# Patient Record
Sex: Female | Born: 1975 | Race: White | Hispanic: No | Smoking: Never smoker
Health system: Southern US, Community
[De-identification: ages and names within clinical notes are randomized; demographics above are authoritative.]

## PROBLEM LIST (undated history)

## (undated) ENCOUNTER — Inpatient Hospital Stay (HOSPITAL_COMMUNITY): Payer: Self-pay

## (undated) DIAGNOSIS — F329 Major depressive disorder, single episode, unspecified: Secondary | ICD-10-CM

## (undated) DIAGNOSIS — IMO0002 Reserved for concepts with insufficient information to code with codable children: Secondary | ICD-10-CM

## (undated) DIAGNOSIS — F429 Obsessive-compulsive disorder, unspecified: Secondary | ICD-10-CM

## (undated) DIAGNOSIS — Z8619 Personal history of other infectious and parasitic diseases: Secondary | ICD-10-CM

## (undated) DIAGNOSIS — O165 Unspecified maternal hypertension, complicating the puerperium: Secondary | ICD-10-CM

## (undated) DIAGNOSIS — B009 Herpesviral infection, unspecified: Secondary | ICD-10-CM

## (undated) DIAGNOSIS — F32A Depression, unspecified: Secondary | ICD-10-CM

## (undated) DIAGNOSIS — O9081 Anemia of the puerperium: Secondary | ICD-10-CM

## (undated) DIAGNOSIS — F419 Anxiety disorder, unspecified: Secondary | ICD-10-CM

## (undated) DIAGNOSIS — N200 Calculus of kidney: Secondary | ICD-10-CM

## (undated) DIAGNOSIS — Z87440 Personal history of urinary (tract) infections: Secondary | ICD-10-CM

## (undated) DIAGNOSIS — Z5189 Encounter for other specified aftercare: Secondary | ICD-10-CM

## (undated) DIAGNOSIS — N9089 Other specified noninflammatory disorders of vulva and perineum: Secondary | ICD-10-CM

## (undated) DIAGNOSIS — D649 Anemia, unspecified: Secondary | ICD-10-CM

## (undated) DIAGNOSIS — K259 Gastric ulcer, unspecified as acute or chronic, without hemorrhage or perforation: Secondary | ICD-10-CM

## (undated) HISTORY — DX: Personal history of other infectious and parasitic diseases: Z86.19

## (undated) HISTORY — DX: Encounter for other specified aftercare: Z51.89

## (undated) HISTORY — DX: Major depressive disorder, single episode, unspecified: F32.9

## (undated) HISTORY — DX: Obsessive-compulsive disorder, unspecified: F42.9

## (undated) HISTORY — DX: Gastric ulcer, unspecified as acute or chronic, without hemorrhage or perforation: K25.9

## (undated) HISTORY — DX: Depression, unspecified: F32.A

## (undated) HISTORY — DX: Other specified noninflammatory disorders of vulva and perineum: N90.89

## (undated) HISTORY — DX: Calculus of kidney: N20.0

## (undated) HISTORY — DX: Reserved for concepts with insufficient information to code with codable children: IMO0002

## (undated) HISTORY — PX: WISDOM TOOTH EXTRACTION: SHX21

## (undated) HISTORY — DX: Anxiety disorder, unspecified: F41.9

## (undated) HISTORY — PX: UPPER GASTROINTESTINAL ENDOSCOPY: SHX188

## (undated) HISTORY — DX: Anemia of the puerperium: O90.81

## (undated) HISTORY — DX: Unspecified maternal hypertension, complicating the puerperium: O16.5

## (undated) HISTORY — DX: Personal history of urinary (tract) infections: Z87.440

## (undated) HISTORY — DX: Herpesviral infection, unspecified: B00.9

## (undated) HISTORY — DX: Anemia, unspecified: D64.9

---
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
## 1998-03-09 HISTORY — PX: EYE SURGERY: SHX253

## 2001-03-09 DIAGNOSIS — R87619 Unspecified abnormal cytological findings in specimens from cervix uteri: Secondary | ICD-10-CM

## 2001-03-09 DIAGNOSIS — IMO0002 Reserved for concepts with insufficient information to code with codable children: Secondary | ICD-10-CM

## 2001-03-09 HISTORY — DX: Unspecified abnormal cytological findings in specimens from cervix uteri: R87.619

## 2001-03-09 HISTORY — DX: Reserved for concepts with insufficient information to code with codable children: IMO0002

## 2003-05-08 ENCOUNTER — Emergency Department (HOSPITAL_COMMUNITY): Admission: EM | Admit: 2003-05-08 | Discharge: 2003-05-09 | Payer: Self-pay

## 2003-10-08 DIAGNOSIS — IMO0001 Reserved for inherently not codable concepts without codable children: Secondary | ICD-10-CM

## 2003-10-08 DIAGNOSIS — Z5189 Encounter for other specified aftercare: Secondary | ICD-10-CM

## 2003-10-08 HISTORY — DX: Reserved for inherently not codable concepts without codable children: IMO0001

## 2003-10-08 HISTORY — DX: Encounter for other specified aftercare: Z51.89

## 2004-10-21 ENCOUNTER — Inpatient Hospital Stay (HOSPITAL_COMMUNITY): Admission: AD | Admit: 2004-10-21 | Discharge: 2004-10-23 | Payer: Self-pay | Admitting: Internal Medicine

## 2004-10-21 ENCOUNTER — Ambulatory Visit: Payer: Self-pay | Admitting: Internal Medicine

## 2004-10-31 ENCOUNTER — Ambulatory Visit: Payer: Self-pay | Admitting: Gastroenterology

## 2004-11-14 ENCOUNTER — Ambulatory Visit: Payer: Self-pay | Admitting: Gastroenterology

## 2004-11-14 ENCOUNTER — Encounter (INDEPENDENT_AMBULATORY_CARE_PROVIDER_SITE_OTHER): Payer: Self-pay | Admitting: Specialist

## 2005-01-27 ENCOUNTER — Ambulatory Visit: Payer: Self-pay | Admitting: Gastroenterology

## 2005-02-06 ENCOUNTER — Ambulatory Visit: Payer: Self-pay | Admitting: Gastroenterology

## 2005-02-06 ENCOUNTER — Encounter (INDEPENDENT_AMBULATORY_CARE_PROVIDER_SITE_OTHER): Payer: Self-pay | Admitting: *Deleted

## 2006-03-09 HISTORY — PX: KNEE SURGERY: SHX244

## 2007-03-10 DIAGNOSIS — N9089 Other specified noninflammatory disorders of vulva and perineum: Secondary | ICD-10-CM

## 2007-03-10 HISTORY — DX: Other specified noninflammatory disorders of vulva and perineum: N90.89

## 2009-03-09 DIAGNOSIS — B009 Herpesviral infection, unspecified: Secondary | ICD-10-CM

## 2009-03-09 HISTORY — DX: Herpesviral infection, unspecified: B00.9

## 2009-05-17 ENCOUNTER — Emergency Department (HOSPITAL_COMMUNITY): Admission: EM | Admit: 2009-05-17 | Discharge: 2009-05-17 | Payer: Self-pay | Admitting: Emergency Medicine

## 2010-06-01 LAB — URINE MICROSCOPIC-ADD ON

## 2010-06-01 LAB — URINALYSIS, ROUTINE W REFLEX MICROSCOPIC
Bilirubin Urine: NEGATIVE
Glucose, UA: NEGATIVE mg/dL
Ketones, ur: NEGATIVE mg/dL
Leukocytes, UA: NEGATIVE
Nitrite: NEGATIVE
Protein, ur: NEGATIVE mg/dL
Specific Gravity, Urine: 1.009 (ref 1.005–1.030)
Urobilinogen, UA: 0.2 mg/dL (ref 0.0–1.0)
pH: 6 (ref 5.0–8.0)

## 2010-06-01 LAB — CBC
HCT: 38.5 % (ref 36.0–46.0)
Hemoglobin: 12.4 g/dL (ref 12.0–15.0)
MCHC: 32.2 g/dL (ref 30.0–36.0)
MCV: 90.1 fL (ref 78.0–100.0)
Platelets: 277 10*3/uL (ref 150–400)
RBC: 4.27 MIL/uL (ref 3.87–5.11)
RDW: 13.4 % (ref 11.5–15.5)
WBC: 8.6 10*3/uL (ref 4.0–10.5)

## 2010-06-01 LAB — DIFFERENTIAL
Basophils Absolute: 0 10*3/uL (ref 0.0–0.1)
Basophils Relative: 0 % (ref 0–1)
Eosinophils Absolute: 0.1 10*3/uL (ref 0.0–0.7)
Eosinophils Relative: 1 % (ref 0–5)
Lymphocytes Relative: 14 % (ref 12–46)
Lymphs Abs: 1.2 10*3/uL (ref 0.7–4.0)
Monocytes Absolute: 0.4 10*3/uL (ref 0.1–1.0)
Monocytes Relative: 5 % (ref 3–12)
Neutro Abs: 6.8 10*3/uL (ref 1.7–7.7)
Neutrophils Relative %: 80 % — ABNORMAL HIGH (ref 43–77)

## 2010-06-01 LAB — URINE CULTURE
Colony Count: NO GROWTH
Culture: NO GROWTH

## 2010-06-01 LAB — POCT I-STAT, CHEM 8
BUN: 7 mg/dL (ref 6–23)
Calcium, Ion: 1.12 mmol/L (ref 1.12–1.32)
Chloride: 107 mEq/L (ref 96–112)
Creatinine, Ser: 1 mg/dL (ref 0.4–1.2)
Glucose, Bld: 97 mg/dL (ref 70–99)
HCT: 34 % — ABNORMAL LOW (ref 36.0–46.0)
Hemoglobin: 11.6 g/dL — ABNORMAL LOW (ref 12.0–15.0)
Potassium: 3.8 mEq/L (ref 3.5–5.1)
Sodium: 138 mEq/L (ref 135–145)
TCO2: 25 mmol/L (ref 0–100)

## 2010-06-01 LAB — POCT PREGNANCY, URINE: Preg Test, Ur: NEGATIVE

## 2010-07-25 NOTE — Discharge Summary (Signed)
NAMELADONNA, Krista George NO.:  [id number]   MEDICAL RECORD NO.:  [id number]          PATIENT TYPE:  INP   LOCATION:  5019                         FACILITY:  [facility]   PHYSICIAN:  Rachael Fee, M.D. DATE OF BIRTH:  [birth year]   DATE OF ADMISSION:  10/21/2004  DATE OF DISCHARGE:  10/23/2004                                 DISCHARGE SUMMARY   ADMITTING DIAGNOSES:  1.  Melena and anemia secondary to presumed upper gastrointestinal bleed.      Probably ulcer disease related to chronic nonsteroidal use. Currently      anemic as well secondary to gastrointestinal bleed.  2.  Chronic headaches of undetermined etiology.  3.  Chronic depression.  4.  History of esophageal ulcers at age 31 treated with silver nitrate.  5.  Status post Lasik surgery for correction of myopia.  6.  History of childhood anemia.  7.  Tonsillectomy.  8.  Allergy to aspirin, has caused dyspnea in the past.   DISCHARGE DIAGNOSES:  1.  Upper gastrointestinal bleed secondary to nonsteroidal-induced antral      ulcer.  2.  Status post upper endoscopy by Dr. Rob Bunting on October 21, 2004; at      which time, the actively bleeding antral ulcer was treated with      epinephrine injection and application of BICAP therapy.  3.  CLO-testing for presence of Helicobacter pylori was negative.  4.  Anemia secondary to acute gastrointestinal blood loss. Status post      transfusion with total of 1-3 units of packed red blood cells.  5.  Chronic headaches of undetermined etiology for which she uses chronic      over-the-counter analgesics. Has outpatient follow-up to be evaluated [facility]      [facility] by Dr. Neale Burly.   BRIEF HISTORY:  Mrs. Krista George is a 35 year old white female who had been  taking a couple of ibuprofen a day for the last several months to control  headaches. She was not clear what caused the headaches but thought that they  might be from sinus problems, but she has never seen a  headache specialist.  For several days prior to presenting to [facility] on October 21, 2004, she  had been having one to two dark tarry stools daily. She progressed to  weakness and fatigue and felt quite dizzy and noticed rapid heart rate. She  had some vague, mild epigastric pain and nausea only when the abdomen was  palpated. She had noted anorexia over the past several days but in general  had a good appetite and had gained 20 pounds within the last several months.  The patient denied dysphagia, heartburn or reflux and other than the black  stool had not had a change in bowel habits. She went to [facility] on [address] [facility] [address] where they measured her hemoglobin at 8.2. MCV was normal.  Coagulases were also normal. She was referred over to [facility]  Emergency Room for management of acute GI bleed.   Ultimately, after being seen in the emergency room by Dr. Leone Payor,  who  ordered her to be admitted, she was transferred to [facility] for  the actual admission as inpatient beds at [facility] were in short  supply.   PROCEDURES:  Upper endoscopy by Dr. Rob Bunting on October 21, 2004  findings noted above.   CONSULTATIONS:  None   LABORATORY:  Initial hemoglobin 5.7, it was 8.9 at discharge. Hematocrit  went from 16.4 up to 26.1. Platelets 257,000. CLO testing for Helicobacter  was negative. PT 14, INR 1.1, PTT 36. Hemoglobin initially 7.8, hemoglobin  low of 5.7. It was 8.9 at discharge. The hematocrit low of 16.4, it was 23.7  at discharge. Platelets 257,000. Sodium 146, potassium 3.7, chloride 104,  carbon dioxide 28, glucose 105, BUN 21, creatinine 0.7. Albumin 3.1. Total  bilirubin, alkaline phosphate, AST, ALT all within normal limits.   HOSPITAL COURSE:  The patient was admitted to a nontelemetry bed. Blood  pressure when she initially arrived at [facility] was 139/88 with  a rapid pulse of 140; however, with hydration blood pressure went  to 120/69  and pulse dropped to 112. During hospitalization, she had serial checks of  CBCs. The initial hemoglobin at [facility] was 7.8 but by the following  morning, several hours later, it was 5.7. She ultimately received  transfusion with a total of three units of packed red blood cells during the  course of hospitalization.   During the course of hospitalization, her tachycardia resolved. Her blood  pressure stabilized and she was afebrile.   Following stabilization, she underwent upper endoscopy on hospital day #1.  She had an actively oozing gastric ulcer which was hemostatic which was  treated with hemostatic therapy by Dr. Christella Hartigan, details are outlined above.   Following the upper endoscopy and during the course of hospitalization, she  did not have any further bowel movements. Hemoglobin and hematocrit  stabilized following transfusions. Her headaches were treated with Fioricet  which seemed to provide adequate analgesia. She was reminded several times  not to resume use of any nonsteroidal or aspirin products for any reason.  The patient initially was kept on n.p.o. but ultimately was tolerating a  regular diet. She was discharged home in stable condition. Plan for follow-  up were to be reendoscoped by Dr. Christella Hartigan on November 14, 2004 and to see the  nurse for preoperative visit before the endoscopy October 31, 2004. She had  an appointment to be seen at the [facility] by Dr. Neale Burly on Monday,  October 27, 2004.   DISCHARGE MEDICATIONS:  1.  Protonix milligrams 40 twice daily for 2 weeks and then once daily.  2.  Iron sulfate 325 milligrams once daily.  3.  Fioricet one to two q.4h. as needed.  4.  Ortho Tri-Cyclen once daily.   Note the patient was cautioned to watch the total amount of acetaminophen  she takes on a daily basis and was advised not to take more than 4000 milligrams a day and was told that each Fioricet does contain 325 milligrams  of acetaminophen  per tablet. She was advised to take less Tylenol if she  were to drink alcohol which is not issue for this patient as she does not  drink regularly.      Jennye Moccasin, P.A. [facility]    ______________________________  Rachael Fee, M.D.    SG/MEDQ  D:  10/31/2004  T:  11/01/2004  Job:  [id number]   cc:   Santiago Glad  [address], [address]  [place]  [place] [address]  Fax: [phone]   Rachael Fee, M.D.   [facility]  [address], [place], [place]  [address]

## 2010-07-25 NOTE — H&P (Signed)
NAMEMILLETTE, HALBERSTAM NO.:  [id number]   MEDICAL RECORD NO.:  [id number]          PATIENT TYPE:  EMS   LOCATION:  ED                           FACILITY:  [facility]   PHYSICIAN:  Iva Boop, M.D. LHCDATE OF BIRTH:  Jan 27, 1976   DATE OF ADMISSION:  10/21/2004  DATE OF DISCHARGE:                                HISTORY & PHYSICAL   CHIEF COMPLAINT:  Melena for 4-5 days with weakness and dizziness.   HISTORY OF PRESENT ILLNESS:  This is a pleasant 35 year old white woman that  has been taking ibuprofen at least two a day since January because of right-  sided and central headache.  The headaches she thinks are related to sinus  problems because she has a lot of congestion.  They are not associated with  any other symptoms at this time that I am aware of.  For the past several  days she started having 1-2 melenic stools a day and became progressively  weak and fatigued.  She spent the weekend in bed and was very tired.  She  has been presyncopal, dizzy and can tell she is tachycardic.  She has not  described any significant abdominal pain other than vague mild epigastric  pain at times, she has nausea when the abdomen is palpated.  She says her  appetite has been generally off in the past few days but she had been eating  more over the past seven months and gained 20 pounds.  She does not describe  dysphagia or significant heartburn, reflux or other changes in he bowel  habits.   In the [facility] emergency room her pulse was 140 initially and then 112  after some fluids.  She was recently on Wellbutrin and that was stopped one  week ago due to irritability.  No other medications other than Ortho-Tri-  Cyclen a day.   ALLERGIES:  No known drug allergies but she says ASPIRIN CAUSES ABDOMINAL  PAIN.  She is sensitive to that.   PAST MEDICAL HISTORY:  1.  Depression.  Three episodes since she was 19.  Followed by a      psychiatrist here.  2.  She said she had  esophageal ulcers at age 66 and was given silver      nitrate.  3.  Lasix surgery though recently required glasses for myopia.  4.  Tonsillectomy.  5.  Was anemic as a child.   REVIEW OF SYSTEMS:  As above.  Has needed glasses.  She wonders if that is  related to her headaches.  She describes no other visual disturbance.  She  has some chronic congestion but no epistaxis or other sinus symptoms.  She  has the headaches that occur on a daily basis.   All other systems negative at this time.   SOCIAL HISTORY:  The patient is single.  She does not smoke having quit at  age 14, rare alcohol, no drug use.  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   FAMILY HISTORY:  Mother  has hypertension.  Father has possible aortic  aneurysm.   PHYSICAL EXAMINATION:  GENERAL:  Reveals a pleasant young woman in no acute  distress.  VITAL SIGNS:  Initially blood pressure 139/88, pulse 140 and then 120/69  with a pulse of 112, temperature 100, respirations 20.  HEENT:  Eyes are anicteric. Conjunctivae are pale.  The mouth and posterior  pharynx are free of lesions.  The mucosa is pale.  NECK:  Supple without thyromegaly or mass.  CHEST:  Clear.  HEART:  S1 and S2 no murmur, rub or gallop.  Tachycardic mildly.  ABDOMEN:  Soft with complaints of nausea when I palpate the epigastrium but  no tenderness.  There is no organomegaly or masses.  Bowel sounds are  present.  She had melena on rectal exam in the emergency department per Dr.  Lynelle Doctor the physician on duty.  I did not repeat that.  EXTREMITIES:  No edema.  SKIN:  Pale.  She has two tattoos on the right lower and left lower  quadrant.  NECK:  Lymph nodes in the neck no supraclavicular or inguinal adenopathy  palpated.  NEUROLOGIC:  She is alert and oriented x3.  Nonfocal exam.   LABORATORY DATA:  Hemoglobin 8.2 [facility] with labs here showing a  hemoglobin of 7.8,  MCV 85, platelets 334, RDW 13, white count 9.4.  CMET  normal except for a glucose of 105, BUN 21 is normal but the creatinine is  0.7.  Pro time is normal with an INR of 1.1.  PTT is normal at 26.   ASSESSMENT:  1.  Melena and anemia secondary to blood loss, with presumed upper      gastrointestinal bleed.  I think this is probably related to her      constant nonsteroidal use.  2.  Chronic headaches, etiology is unclear at this point.  Neurologic exam      is nonfocal.  Will need further evaluation though.  Would hold this      pending GI evaluation.  3.  Chronic depression.   PLAN:  1.  Admit.  2.  Hydrate.  3.  Transfusion has been recommended both by the emergency room physician      and myself and the patient declines at this point.  Risks, benefits,      indications were described including the risks of transfusion, reaction,      infection, hepatitis and AID's but that is quite rare.  I think that      given her presyncope problems that she will have a tough time without      some blood though we will await and see at this point.  4.  Plan for EGD by Dr. Wendall Papa later today to sort out the cause of her      bleeding.  5.  Further headache evaluation over time, she certainly needs to stop      nonsteroidal use.  6.  Further plans pending the above.      Iva Boop, M.D. [facility]  Electronically Signed     CEG/MEDQ  D:  10/21/2004  T:  10/21/2004  Job:  [id number]   cc:   [facility]

## 2010-09-29 IMAGING — CT CT ABD-PELV W/ CM
2 of 4 series · 17 of 46 positions shown, 19 images · IV contrast (agent unspecified)
Comparison: 05/09/2003

CLINICAL DATA: Right lower quadrant and flank pain.

CT ABDOMEN AND PELVIS WITH CONTRAST
TECHNIQUE: Multidetector CT imaging of the abdomen and pelvis was
performed following the standard protocol during bolus
administration of intravenous contrast.
Contrast: 100 ml Dmnipaque-1BB and oral contrast

[Series 2: rtn a/p with · axial · 0.77mm/px · z∈[-486,-60]mm · 14 of 99 slices shown, 16 images]
[im 7/99  soft-tissue]
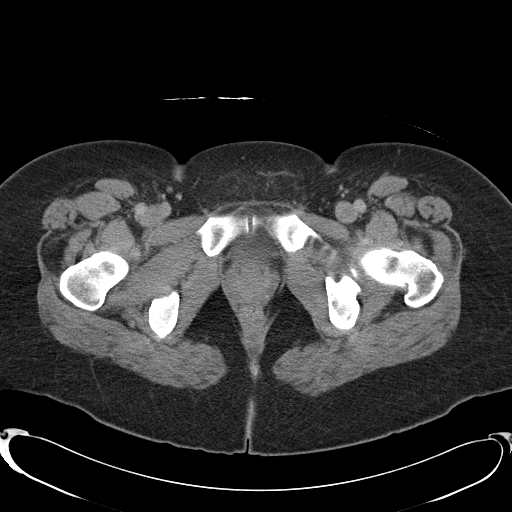
[im 7/99  bone]
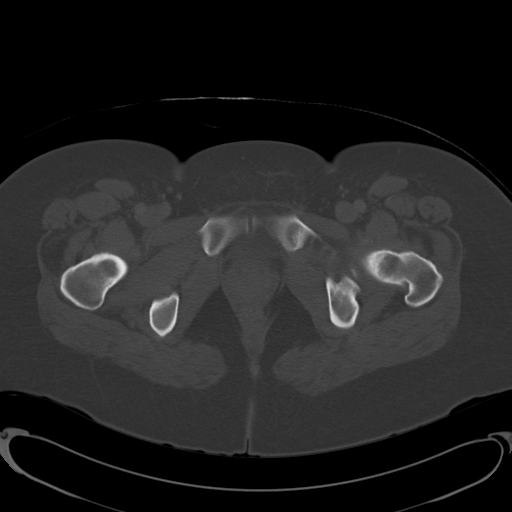
[im 13/99  soft-tissue]
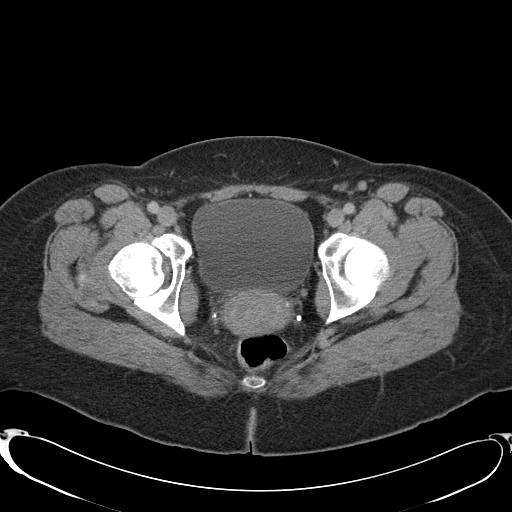
[im 19/99  soft-tissue]
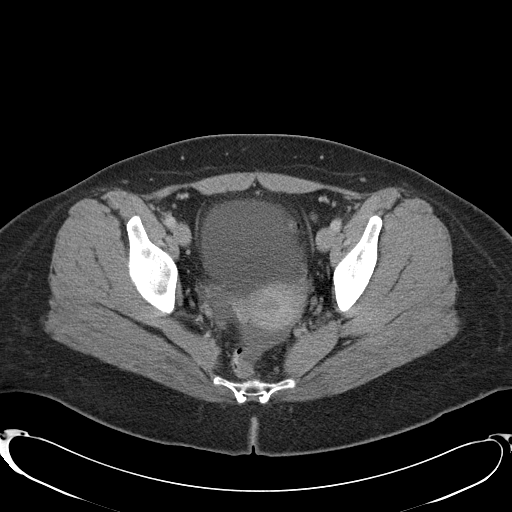
[im 26/99  soft-tissue]
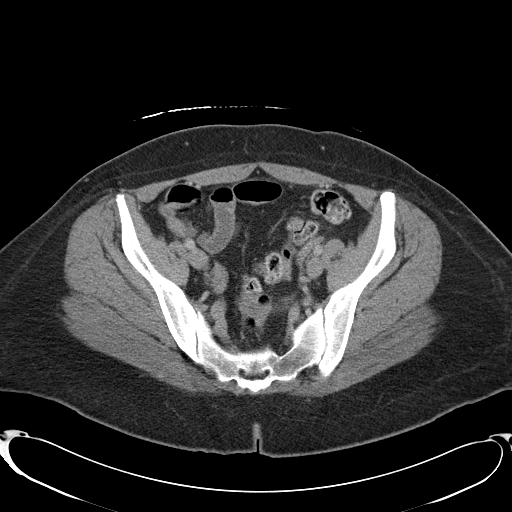
[im 32/99  soft-tissue]
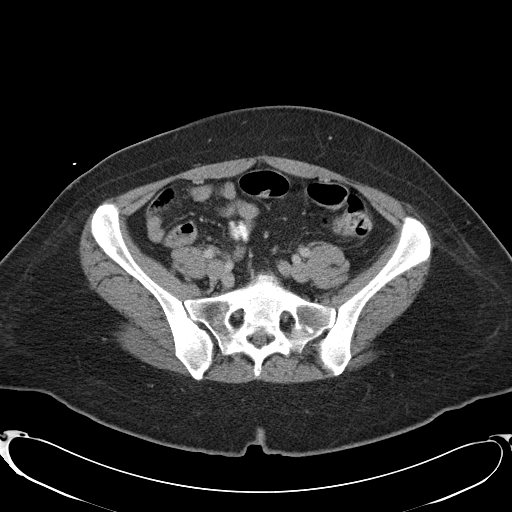
[im 38/99  soft-tissue]
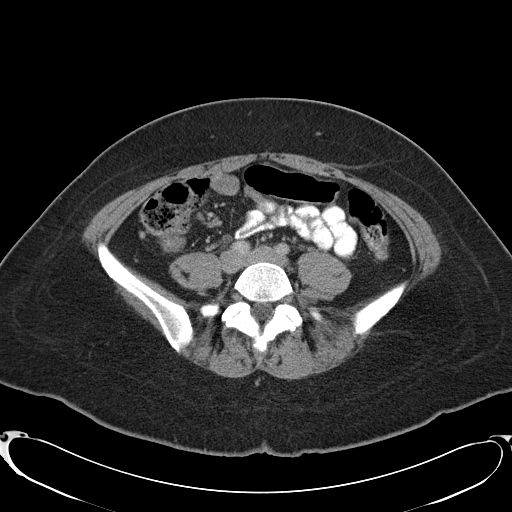
[im 45/99  soft-tissue]
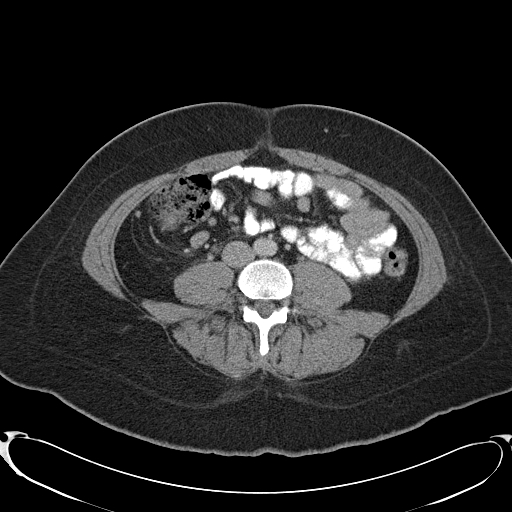
[im 54/99  soft-tissue]
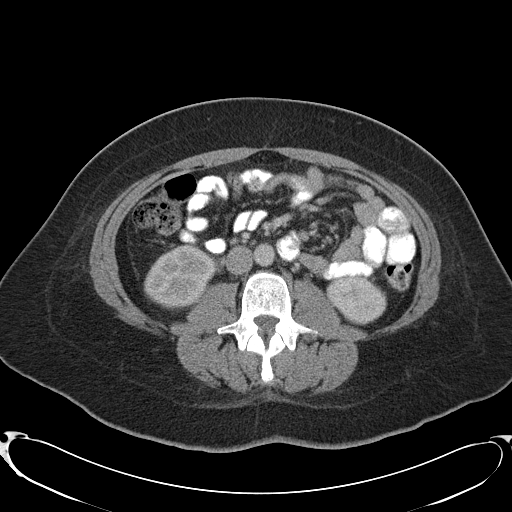
[im 61/99  soft-tissue]
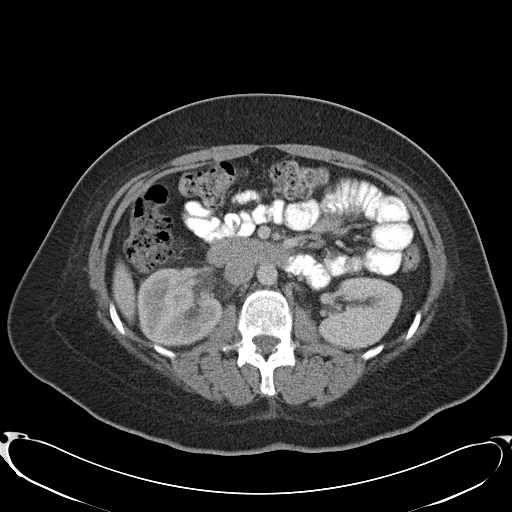
[im 61/99  bone]
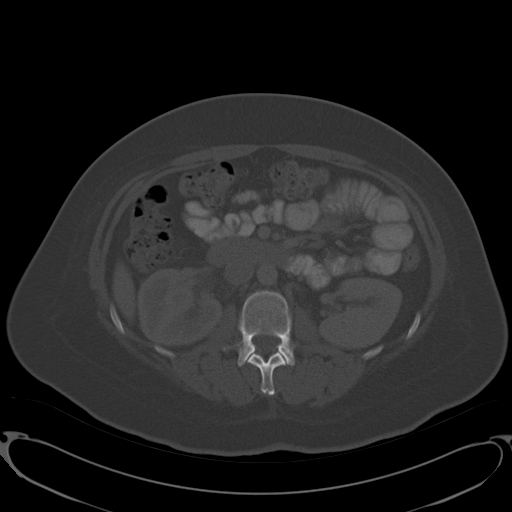
[im 67/99  soft-tissue]
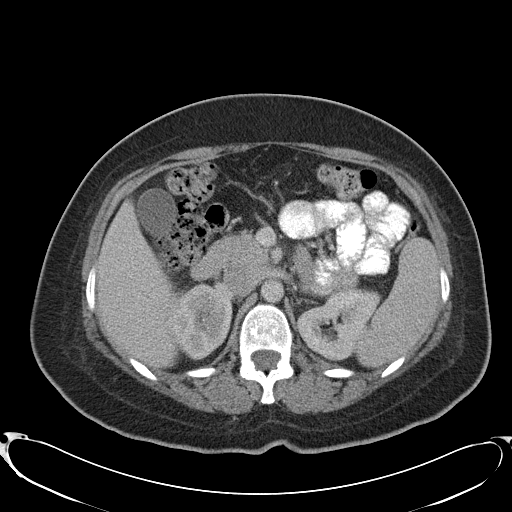
[im 73/99  soft-tissue]
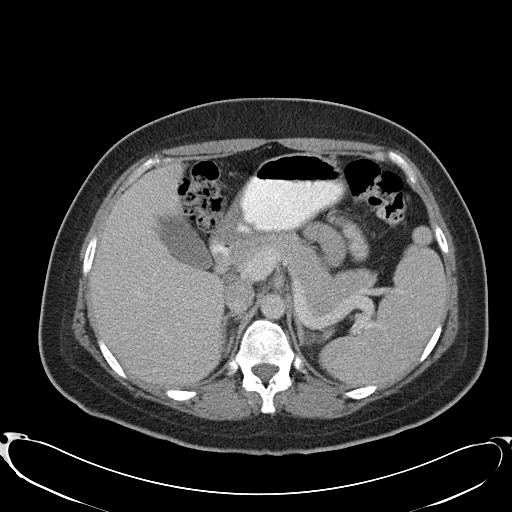
[im 80/99  soft-tissue]
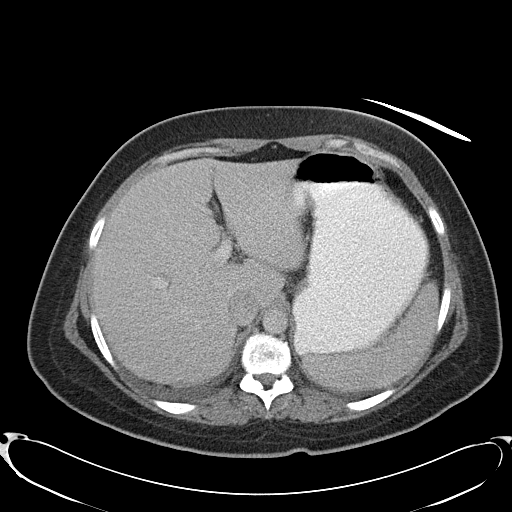
[im 86/99  soft-tissue]
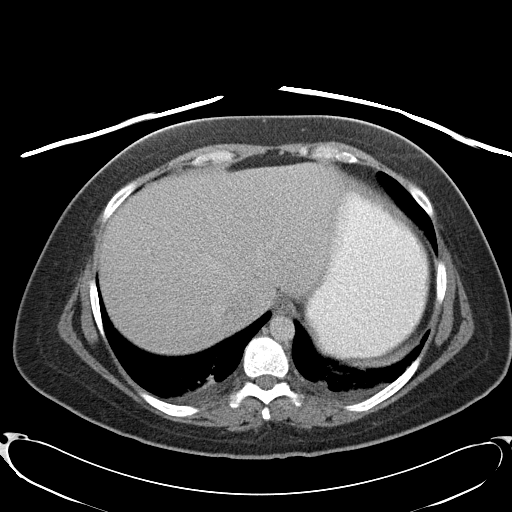
[im 92/99  soft-tissue]
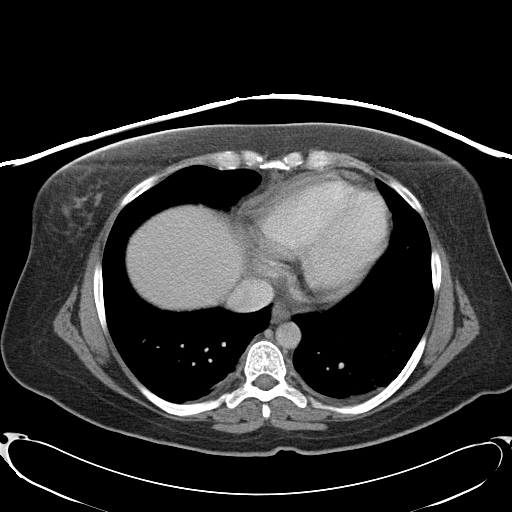

[Series 602: <mpr thick range> · coronal · 0.97mm/px · 3 of 91 slices shown]
[im 23/91  soft-tissue]
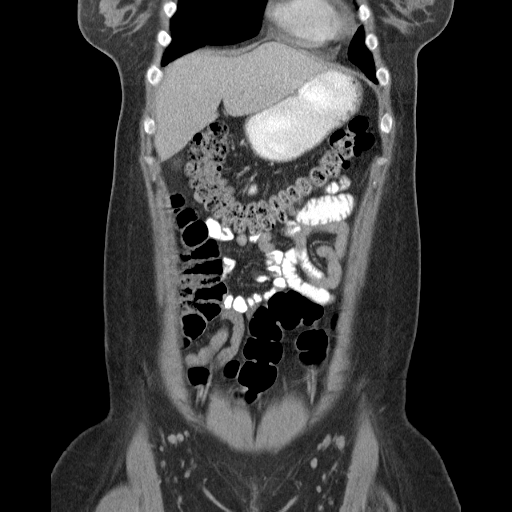
[im 46/91  soft-tissue]
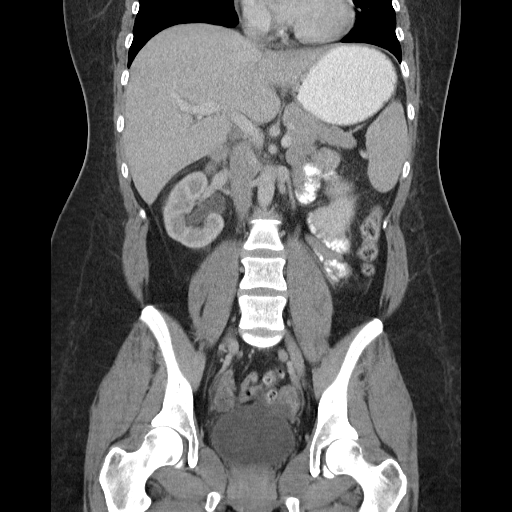
[im 68/91  soft-tissue]
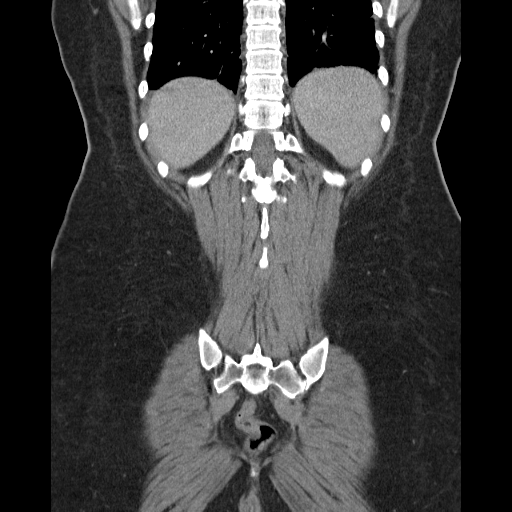

[17 of 46 positions shown; findings below may reference images not displayed]

FINDINGS: Tiny bilateral pleural effusion is noted at the lung
bases which are new compared to 7225 exam.

Small less than 5 mm calculus again seen in the lower pole of the
left kidney.  Mild right hydronephrosis and ureterectasis seen with
delayed contrast excretion by the right kidney.  A 4 mm calculus is
seen in the distal right ureter near the ureterovesicle junction.
Other pelvic phleboliths are also noted.

The liver, spleen, pancreas, and adrenal glands are normal in
appearance.  Shotty lymph nodes are seen within the small bowel
mesentery, however no focal inflammatory process identified.  Bowel
loops appear normal.  Tiny amount of free fluid is seen in the
pelvic cul-de-sac, which is nonspecific.
IMPRESSION: 1.  4 mm distal right ureteral calculus near the UVJ.  Mild right
hydroureteronephrosis.
2.  Shotty mesenteric lymph nodes and minimal free fluid in pelvic
cul-de-sac.  These findings are nonspecific, but may be seen with
mesenteric adenitis.
3.  Tiny bilateral pleural effusions.

## 2011-01-22 LAB — OB RESULTS CONSOLE RUBELLA ANTIBODY, IGM: Rubella: IMMUNE

## 2011-01-22 LAB — OB RESULTS CONSOLE HIV ANTIBODY (ROUTINE TESTING): HIV: NONREACTIVE

## 2011-01-22 LAB — OB RESULTS CONSOLE HEPATITIS B SURFACE ANTIGEN: Hepatitis B Surface Ag: NEGATIVE

## 2011-03-10 NOTE — L&D Delivery Note (Addendum)
Delivery Note At 4:34 PM a viable female, "Maxie Better", was delivered via Vaginal, Spontaneous Delivery (Presentation: Left Occiput Anterior).  APGAR: 9, 9; weight .   Placenta status: Intact, Spontaneous.  Cord: Loose CAN x 1, reduced over vtx at delivery, with 3 vessels with the following complications: None.  Cord pH: NA  Anesthesia: Epidural  Episiotomy: None Lacerations: 2nd degree Suture Repair: 3.0 monocryl Est. Blood Loss (mL): 300 cc Small area of oozing from vaginal laceration after repair.  Placed sponge for hemostasis, removed before transfer with good hemostasis.  Mom to postpartum.  Baby to skin to skin.  Had grunting after delivery, but good color..Taken to nursery for observation. Sensitive to ibuprophen--Tylenol ordered for pp use.   Nigel Bridgeman 09/03/2011, 5:21 PM

## 2011-04-29 ENCOUNTER — Other Ambulatory Visit: Payer: Self-pay

## 2011-05-06 ENCOUNTER — Other Ambulatory Visit (INDEPENDENT_AMBULATORY_CARE_PROVIDER_SITE_OTHER): Payer: BC Managed Care – PPO

## 2011-05-06 ENCOUNTER — Other Ambulatory Visit: Payer: BC Managed Care – PPO

## 2011-05-06 ENCOUNTER — Other Ambulatory Visit: Payer: Self-pay

## 2011-05-06 ENCOUNTER — Encounter (INDEPENDENT_AMBULATORY_CARE_PROVIDER_SITE_OTHER): Payer: BC Managed Care – PPO | Admitting: Registered Nurse

## 2011-05-06 ENCOUNTER — Encounter: Payer: Self-pay | Admitting: Registered Nurse

## 2011-05-06 DIAGNOSIS — Z3689 Encounter for other specified antenatal screening: Secondary | ICD-10-CM

## 2011-05-06 DIAGNOSIS — Z331 Pregnant state, incidental: Secondary | ICD-10-CM

## 2011-05-07 ENCOUNTER — Inpatient Hospital Stay (HOSPITAL_COMMUNITY): Admission: AD | Admit: 2011-05-07 | Payer: Self-pay | Source: Ambulatory Visit | Admitting: Obstetrics and Gynecology

## 2011-06-02 ENCOUNTER — Encounter: Payer: BC Managed Care – PPO | Admitting: Obstetrics and Gynecology

## 2011-06-03 ENCOUNTER — Encounter (INDEPENDENT_AMBULATORY_CARE_PROVIDER_SITE_OTHER): Payer: BC Managed Care – PPO | Admitting: Registered Nurse

## 2011-06-03 DIAGNOSIS — Z331 Pregnant state, incidental: Secondary | ICD-10-CM

## 2011-06-15 ENCOUNTER — Other Ambulatory Visit: Payer: Self-pay

## 2011-06-15 DIAGNOSIS — Z3689 Encounter for other specified antenatal screening: Secondary | ICD-10-CM

## 2011-06-16 ENCOUNTER — Other Ambulatory Visit: Payer: BC Managed Care – PPO

## 2011-06-16 ENCOUNTER — Ambulatory Visit (INDEPENDENT_AMBULATORY_CARE_PROVIDER_SITE_OTHER): Payer: BC Managed Care – PPO | Admitting: Obstetrics and Gynecology

## 2011-06-16 ENCOUNTER — Other Ambulatory Visit: Payer: Self-pay | Admitting: Obstetrics and Gynecology

## 2011-06-16 ENCOUNTER — Encounter: Payer: Self-pay | Admitting: Obstetrics and Gynecology

## 2011-06-16 ENCOUNTER — Other Ambulatory Visit (INDEPENDENT_AMBULATORY_CARE_PROVIDER_SITE_OTHER): Payer: BC Managed Care – PPO

## 2011-06-16 VITALS — BP 114/78 | Wt 230.0 lb

## 2011-06-16 DIAGNOSIS — Z3689 Encounter for other specified antenatal screening: Secondary | ICD-10-CM

## 2011-06-16 DIAGNOSIS — Z331 Pregnant state, incidental: Secondary | ICD-10-CM

## 2011-06-16 DIAGNOSIS — Z3480 Encounter for supervision of other normal pregnancy, unspecified trimester: Secondary | ICD-10-CM

## 2011-06-16 DIAGNOSIS — Z87442 Personal history of urinary calculi: Secondary | ICD-10-CM

## 2011-06-16 DIAGNOSIS — E669 Obesity, unspecified: Secondary | ICD-10-CM | POA: Insufficient documentation

## 2011-06-16 LAB — US OB FOLLOW UP

## 2011-06-16 NOTE — Progress Notes (Deleted)
Lost weight just prior to preg.

## 2011-06-16 NOTE — Progress Notes (Signed)
Glucola given at 2:15. CC. Pt has no issues today.

## 2011-06-17 LAB — RPR

## 2011-06-17 LAB — GLUCOSE TOLERANCE, 1 HOUR: Glucose, 1 Hour GTT: 129 mg/dL (ref 70–140)

## 2011-06-17 LAB — HEMOGLOBIN: Hemoglobin: 11.9 g/dL — ABNORMAL LOW (ref 12.0–15.0)

## 2011-06-18 ENCOUNTER — Other Ambulatory Visit: Payer: BC Managed Care – PPO

## 2011-06-18 ENCOUNTER — Encounter: Payer: BC Managed Care – PPO | Admitting: Registered Nurse

## 2011-06-25 ENCOUNTER — Telehealth: Payer: Self-pay | Admitting: Obstetrics and Gynecology

## 2011-06-25 NOTE — Telephone Encounter (Signed)
Routed to [facility]

## 2011-06-25 NOTE — Telephone Encounter (Signed)
TC from pt.States today and yesterday has had headache.  and inc number of stools.  Are loose but not watery. T-?   No cont. +FM  No vag leakage.   Suggested increased fluids,  BRAT diet.  May take Tylenol.  To call is no improvement or other sx.   Pt verbalizes comprehension. Plan

## 2011-06-25 NOTE — Telephone Encounter (Signed)
Add. Entry.  Plan OK'd by [facility].

## 2011-06-25 NOTE — Telephone Encounter (Signed)
TC to pt. LM to return call.  Instructions given to call after hours if unable to reach prior to office  Closing.

## 2011-06-25 NOTE — Telephone Encounter (Signed)
Routed to triage 

## 2011-07-14 ENCOUNTER — Encounter: Payer: Self-pay | Admitting: Obstetrics and Gynecology

## 2011-07-14 ENCOUNTER — Ambulatory Visit (INDEPENDENT_AMBULATORY_CARE_PROVIDER_SITE_OTHER): Payer: BC Managed Care – PPO | Admitting: Obstetrics and Gynecology

## 2011-07-14 VITALS — BP 138/88 | Ht 66.5 in | Wt 243.0 lb

## 2011-07-14 DIAGNOSIS — Z3400 Encounter for supervision of normal first pregnancy, unspecified trimester: Secondary | ICD-10-CM

## 2011-07-14 NOTE — Progress Notes (Signed)
Doing well. No PIH symptoms. Just got off work. Will increase rest, RTO on Monday after increasing rest this week. If BP warrants, may need PIH labs and 24 hour urine then. Watch weight. No significant edema.

## 2011-07-14 NOTE — Progress Notes (Signed)
Increased discharge 128/86 BP-right arm

## 2011-07-19 ENCOUNTER — Inpatient Hospital Stay (HOSPITAL_COMMUNITY)
Admission: AD | Admit: 2011-07-19 | Discharge: 2011-07-19 | Disposition: A | Discharge status: Home / Self Care | Payer: BC Managed Care – PPO | Source: Ambulatory Visit | Attending: Obstetrics and Gynecology | Admitting: Obstetrics and Gynecology

## 2011-07-19 ENCOUNTER — Encounter (HOSPITAL_COMMUNITY): Payer: Self-pay | Admitting: Obstetrics and Gynecology

## 2011-07-19 ENCOUNTER — Other Ambulatory Visit: Payer: Self-pay

## 2011-07-19 DIAGNOSIS — O99891 Other specified diseases and conditions complicating pregnancy: Secondary | ICD-10-CM | POA: Insufficient documentation

## 2011-07-19 DIAGNOSIS — R079 Chest pain, unspecified: Secondary | ICD-10-CM | POA: Insufficient documentation

## 2011-07-19 DIAGNOSIS — Z331 Pregnant state, incidental: Secondary | ICD-10-CM

## 2011-07-19 DIAGNOSIS — R002 Palpitations: Secondary | ICD-10-CM

## 2011-07-19 HISTORY — DX: Herpesviral infection, unspecified: B00.9

## 2011-07-19 LAB — URIC ACID: Uric Acid, Serum: 3.7 mg/dL (ref 2.4–7.0)

## 2011-07-19 LAB — COMPREHENSIVE METABOLIC PANEL
ALT: 15 U/L (ref 0–35)
AST: 13 U/L (ref 0–37)
Albumin: 2.6 g/dL — ABNORMAL LOW (ref 3.5–5.2)
Alkaline Phosphatase: 76 U/L (ref 39–117)
BUN: 6 mg/dL (ref 6–23)
CO2: 23 mEq/L (ref 19–32)
Calcium: 9 mg/dL (ref 8.4–10.5)
Chloride: 103 mEq/L (ref 96–112)
Creatinine, Ser: 0.49 mg/dL — ABNORMAL LOW (ref 0.50–1.10)
GFR calc Af Amer: >90 mL/min (ref >90)
GFR calc non Af Amer: >90 mL/min (ref >90)
Glucose, Bld: 86 mg/dL (ref 70–99)
Potassium: 3.7 mEq/L (ref 3.5–5.1)
Sodium: 135 mEq/L (ref 135–145)
Total Bilirubin: 0.2 mg/dL — ABNORMAL LOW (ref 0.3–1.2)
Total Protein: 5.7 g/dL — ABNORMAL LOW (ref 6.0–8.3)

## 2011-07-19 LAB — URINALYSIS, ROUTINE W REFLEX MICROSCOPIC
Bilirubin Urine: NEGATIVE
Glucose, UA: NEGATIVE mg/dL
Hgb urine dipstick: NEGATIVE
Ketones, ur: NEGATIVE mg/dL
Leukocytes, UA: NEGATIVE
Nitrite: NEGATIVE
Protein, ur: NEGATIVE mg/dL
Specific Gravity, Urine: <1.005 — ABNORMAL LOW (ref 1.005–1.030)
Urobilinogen, UA: 0.2 mg/dL (ref 0.0–1.0)
pH: 7 (ref 5.0–8.0)

## 2011-07-19 LAB — CBC
HCT: 32.7 % — ABNORMAL LOW (ref 36.0–46.0)
Hemoglobin: 11.1 g/dL — ABNORMAL LOW (ref 12.0–15.0)
MCH: 29.8 pg (ref 26.0–34.0)
MCHC: 33.9 g/dL (ref 30.0–36.0)
MCV: 87.7 fL (ref 78.0–100.0)
Platelets: 212 10*3/uL (ref 150–400)
RBC: 3.73 MIL/uL — ABNORMAL LOW (ref 3.87–5.11)
RDW: 13.1 % (ref 11.5–15.5)
WBC: 9.8 10*3/uL (ref 4.0–10.5)

## 2011-07-19 LAB — CARDIAC PANEL(CRET KIN+CKTOT+MB+TROPI)
CK, MB: 1.4 ng/mL (ref 0.3–4.0)
Relative Index: INVALID (ref 0.0–2.5)
Total CK: 31 U/L (ref 7–177)
Troponin I: <0.3 ng/mL (ref <0.30)

## 2011-07-19 LAB — LACTATE DEHYDROGENASE: LDH: 164 U/L (ref 94–250)

## 2011-07-19 NOTE — Discharge Instructions (Signed)
The nurse will call from our office to set an appointment to see [facility] cardiology Keep your scheduled OB appointment

## 2011-07-19 NOTE — MAU Note (Signed)
"  I was at work yesterday and my heart began to pound @ 12:15pm.  I went to lay down and it improved after laying there for 45 minutes.  I got up and it was still better, but then it began to pound again around 2:15pm.  I then went home for the day.  The pounding started back up again this morning shortly after I woke up this morning.  All three times that this pounding heartbeat occurred was while I was sitting in a chair."

## 2011-07-19 NOTE — MAU Provider Note (Addendum)
  History     CSN: [id number]  Arrival date and time: 07/19/11 9629   None     Chief Complaint  Patient presents with  . other     "My heart is pounding"   HPI Comments: Pt is a 35yo white female at [redacted]w[redacted]d with c/o chest pounding that started Friday. It happened when she was sitting in a chair at work. She suddenly felt flush and sweating and says she felt it in her chest and neck, denies palpitations or heart racing. Felt some SOB when it happened. She laid down and it resolved, than happened again on Saturday and again today, when she woke up. She says its resolved now. She denies any chest pain.  She denies any pain or ctx, no LOF, VB, +FM. She denies any HA/N/V, no swelling.      Past Medical History  Diagnosis Date  . Abnormal Pap smear   . Anemia   . HSV-2 infection 2011    "no outbreaks ever"    Past Surgical History  Procedure Date  . Tonsillectomy 1986  . Eye surgery 2000    LASIX    Family History  Problem Relation Age of Onset  . Hypertension Mother   . Mental illness Mother   . Kidney disease Sister     History  Substance Use Topics  . Smoking status: Never Smoker   . Smokeless tobacco: Never Used  . Alcohol Use: No    Allergies:  Allergies  Allergen Reactions  . Asa (Aspirin) Other (See Comments)    Pt has bleeding ulcer  . Nsaids Other (See Comments)    Pt has bleeding ulcer    Prescriptions prior to admission  Medication Sig Dispense Refill  . acetaminophen (TYLENOL) 325 MG tablet Take 650 mg by mouth daily as needed. For pain      . calcium carbonate (TUMS - DOSED IN MG ELEMENTAL CALCIUM) 500 MG chewable tablet Chew 2 tablets by mouth daily as needed. For heartburn      . Prenatal Vit-Fe Fumarate-FA (PRENATAL MULTIVITAMIN) TABS Take 1 tablet by mouth daily.        Review of Systems  All other systems reviewed and are negative.   Physical Exam   Blood pressure 135/66, pulse 96, temperature 98.3 F (36.8 C), temperature source Oral,  resp. rate 20, height 5' 6.5" (1.689 m), weight 245 lb (111.131 kg), last menstrual period 11/11/2010, SpO2 99.00%.  Physical Exam  Nursing note and vitals reviewed. Constitutional: She appears well-developed and well-nourished. No distress.  HENT:  Head: Normocephalic.  Neck: Normal range of motion. No thyromegaly present.  Cardiovascular: Normal rate, regular rhythm, normal heart sounds and intact distal pulses.  Exam reveals no gallop and no friction rub.   No murmur heard. Respiratory: Effort normal and breath sounds normal. No respiratory distress. She exhibits no tenderness.  pelvic deferred  FHR 150 +accels, no decels, CAT 1 Toco quiet  MAU Course  Procedures ekg Cbc, cmet, ldh, uric acid   Assessment and Plan  IUP at [redacted]w[redacted]d Labs normal EKG pending Will consult w Dr. Marcy Siren M 07/19/2011, 10:31 AM   D/W Dr Normand Sloop Will order cardiac panel and consult cardiology  Cardiac labs are normal D/C home in stable condition Will refer to [facility] cardiology for OP fu Keep sched OB appt, [facility] and PTL precautions and PIH sx's reviewed

## 2011-07-19 NOTE — Progress Notes (Signed)
Pt states she feel much better.  No chest pain, shortness of breath or heart palpitations Physical Examination: General appearance - alert, well appearing, and in no distress Mental status - alert, oriented to person, place, and time Chest - clear to auscultation, no wheezes, rales or rhonchi, symmetric air entry Heart - normal rate and regular rhythm, no JVD Abdomen - soft, nontender, nondistended, no masses or organomegaly, gravid Neurological - alert, oriented, normal speech, no focal findings or movement disorder noted Extremities - no edema, redness or tenderness in the calves or thighs Skin - normal coloration and turgor, no rashes, no suspicious skin lesions noted Occasional heart palpitations  [redacted] weeks pregnant I consulted with Patrica Duel (cardiologist).  He felt her EKG was normal. And if her cardiac enzymes were normal she could follow up as an out patient Pt is stable and doing well.  I agree with the above plan

## 2011-07-20 ENCOUNTER — Encounter: Payer: Self-pay | Admitting: Obstetrics and Gynecology

## 2011-07-20 ENCOUNTER — Other Ambulatory Visit: Payer: Self-pay

## 2011-07-20 ENCOUNTER — Ambulatory Visit (INDEPENDENT_AMBULATORY_CARE_PROVIDER_SITE_OTHER): Payer: BC Managed Care – PPO | Admitting: Obstetrics and Gynecology

## 2011-07-20 ENCOUNTER — Telehealth: Payer: Self-pay

## 2011-07-20 VITALS — BP 126/80 | Wt 245.0 lb

## 2011-07-20 DIAGNOSIS — Z331 Pregnant state, incidental: Secondary | ICD-10-CM

## 2011-07-20 NOTE — Progress Notes (Signed)
The patient reports that she has not worked up this week and that she has had no episodes of tachycardia.  She wonders if this may be stress related.  Her blood pressure is also much better today.  Chest clear.  Heart regular rate and rhythm.  Cardiology exam is to be scheduled in the near future.  Return to office in 2 weeks.  Call for questions or concerns.  Dr. Stefano Gaul

## 2011-07-20 NOTE — Telephone Encounter (Signed)
Spoke with pt rgd referral per SL pt need ref to [facility] Cardiology pt has appt 07/1511 at 11:15 pt voice undertanding

## 2011-07-20 NOTE — Progress Notes (Signed)
Pt states she is concerned about her heart rate  Pt states Friday -Sunday she noticed her heart was pounding a lot harder than usual  She call the on call line and went to [facility] hospital and saw Dr.Dillard. She is being referred to a cardiologist

## 2011-07-22 ENCOUNTER — Ambulatory Visit (INDEPENDENT_AMBULATORY_CARE_PROVIDER_SITE_OTHER): Payer: BC Managed Care – PPO | Admitting: Cardiovascular Disease

## 2011-07-22 ENCOUNTER — Encounter: Payer: Self-pay | Admitting: Cardiovascular Disease

## 2011-07-22 VITALS — BP 118/86 | HR 105 | Ht 66.0 in | Wt 244.0 lb

## 2011-07-22 DIAGNOSIS — R002 Palpitations: Secondary | ICD-10-CM

## 2011-07-22 DIAGNOSIS — R0609 Other forms of dyspnea: Secondary | ICD-10-CM

## 2011-07-22 DIAGNOSIS — R0989 Other specified symptoms and signs involving the circulatory and respiratory systems: Secondary | ICD-10-CM

## 2011-07-22 DIAGNOSIS — R079 Chest pain, unspecified: Secondary | ICD-10-CM | POA: Insufficient documentation

## 2011-07-22 DIAGNOSIS — R0600 Dyspnea, unspecified: Secondary | ICD-10-CM

## 2011-07-22 NOTE — Assessment & Plan Note (Signed)
Benign sounding normal ECG and negative ER eval.  F/U event monitor

## 2011-07-22 NOTE — Progress Notes (Signed)
Patient ID: Krista George, female   DOB: January 20, 1976, 36 y.o.   MRN: [id number] 36 yo referred by Dr Stefano Gaul for palpitations and chest pain.  Started last week at work.  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  Pain atypical non exertional and sharp on right side.  She is [redacted] weeks pregnant with no complications.  Having baby girl [place].  Palpitations sporadic last week.  Feels heart beating hard.  Seen in ER over weekend and NSR with no arrhythmia.  NO history of drug use, anemia or thyroid disease.  No previous heart disease, or syncope.  Plans on normal vaginal delivery.  ROS: Denies fever, malais, weight loss, blurry vision, decreased visual acuity, cough, sputum, SOB, hemoptysis, pleuritic pain, palpitaitons, heartburn, abdominal pain, melena, lower extremity edema, claudication, or rash.  All other systems reviewed and negative   General: Affect appropriate Healthy:  appears stated age HEENT: normal Neck supple with no adenopathy JVP normal no bruits no thyromegaly Lungs clear with no wheezing and good diaphragmatic motion Heart:  S1/S2 no murmur,rub, gallop or click PMI normal Abdomen: benighn, BS positve, no tenderness, no AAA no bruit.  No HSM or HJR Distal pulses intact with no bruits No edema Neuro non-focal Skin warm and dry No muscular weakness  Medications Current Outpatient Prescriptions  Medication Sig Dispense Refill  . acetaminophen (TYLENOL) 325 MG tablet Take 650 mg by mouth daily as needed. For pain      . calcium carbonate (TUMS - DOSED IN MG ELEMENTAL CALCIUM) 500 MG chewable tablet Chew 2 tablets by mouth daily as needed. For heartburn      . Prenatal Vit-Fe Fumarate-FA (PRENATAL MULTIVITAMIN) TABS Take 1 tablet by mouth daily.        Allergies Asa and Nsaids  Family History: Family History  Problem Relation Age of Onset  . Hypertension Mother   . Mental illness Mother   . Kidney disease Sister     Social History: History   Social History  .  Marital Status: Single    Spouse Name: N/A    Number of Children: N/A  . Years of Education: N/A   Occupational History  . Not on file.   Social History Main Topics  . Smoking status: Never Smoker   . Smokeless tobacco: Never Used  . Alcohol Use: No  . Drug Use: No  . Sexually Active: No     Pt currently pregnant   Other Topics Concern  . Not on file   Social History Narrative  . No narrative on file    Electrocardiogram:  07/19/11 SR rate 94 T inv 3 poor R wave progression  Assessment and Plan

## 2011-07-22 NOTE — Patient Instructions (Signed)
Your physician recommends that you schedule a follow-up appointment in: AS NEEDED Your physician recommends that you continue on your current medications as directed. Please refer to the Current Medication list given to you today. Your physician has requested that you have an echocardiogram. Echocardiography is a painless test that uses sound waves to create images of your heart. It provides your doctor with information about the size and shape of your heart and how well your heart's chambers and valves are working. This procedure takes approximately one hour. There are no restrictions for this procedure. DX DYSPNEA Your physician has recommended that you wear an event monitor. Event monitors are medical devices that record the heart's electrical activity. Doctors most often [place] these monitors to diagnose arrhythmias. Arrhythmias are problems with the speed or rhythm of the heartbeat. The monitor is a small, portable device. You can wear one while you do your normal daily activities. This is usually used to diagnose what is causing palpitations/syncope (passing out). DX  PALPITATIONS  THIS WEEK IF POSSIBLE

## 2011-07-22 NOTE — Assessment & Plan Note (Signed)
Atypical ECG ok  F/U echo

## 2011-07-29 ENCOUNTER — Ambulatory Visit (HOSPITAL_COMMUNITY): Payer: BC Managed Care – PPO

## 2011-08-05 ENCOUNTER — Encounter: Payer: Self-pay | Admitting: Obstetrics and Gynecology

## 2011-08-05 ENCOUNTER — Ambulatory Visit (INDEPENDENT_AMBULATORY_CARE_PROVIDER_SITE_OTHER): Payer: BC Managed Care – PPO | Admitting: Obstetrics and Gynecology

## 2011-08-05 VITALS — BP 122/80 | Wt 249.0 lb

## 2011-08-05 DIAGNOSIS — O26849 Uterine size-date discrepancy, unspecified trimester: Secondary | ICD-10-CM

## 2011-08-05 DIAGNOSIS — N760 Acute vaginitis: Secondary | ICD-10-CM

## 2011-08-05 DIAGNOSIS — L293 Anogenital pruritus, unspecified: Secondary | ICD-10-CM

## 2011-08-05 DIAGNOSIS — N898 Other specified noninflammatory disorders of vagina: Secondary | ICD-10-CM

## 2011-08-05 DIAGNOSIS — N762 Acute vulvitis: Secondary | ICD-10-CM

## 2011-08-05 LAB — POCT WET PREP (WET MOUNT)
Clue Cells Wet Prep Whiff POC: NEGATIVE
pH: 4.5

## 2011-08-05 MED ORDER — CLOTRIMAZOLE-BETAMETHASONE 1-0.05 % EX CREA: TOPICAL_CREAM | Freq: Every day | CUTANEOUS | Status: DC

## 2011-08-05 MED ORDER — VALACYCLOVIR HCL 500 MG PO TABS: 500.0000 mg | ORAL_TABLET | Freq: Every day | ORAL | Status: DC

## 2011-08-05 NOTE — Progress Notes (Signed)
C/o ext itching Wet prep Results for orders placed in visit on 08/05/11  POCT WET PREP (WET MOUNT)      Component Value Range   Source Wet Prep POC vaginal     WBC, Wet Prep HPF POC       Bacteria Wet Prep HPF POC none     BACTERIA WET PREP MORPHOLOGY POC       Clue Cells Wet Prep HPF POC None     CLUE CELLS WET PREP WHIFF POC Negative Whiff     Yeast Wet Prep HPF POC None     KOH Wet Prep POC       Trichomonas Wet Prep HPF POC none     pH 4.5     Valtrex for suppression U/S and NV for EFW secondary to S>D Lotrisone for vulvitis

## 2011-08-19 ENCOUNTER — Ambulatory Visit (INDEPENDENT_AMBULATORY_CARE_PROVIDER_SITE_OTHER): Payer: BC Managed Care – PPO

## 2011-08-19 ENCOUNTER — Ambulatory Visit (INDEPENDENT_AMBULATORY_CARE_PROVIDER_SITE_OTHER): Payer: BC Managed Care – PPO | Admitting: Obstetrics and Gynecology

## 2011-08-19 VITALS — BP 104/74 | Wt 252.0 lb

## 2011-08-19 DIAGNOSIS — O26849 Uterine size-date discrepancy, unspecified trimester: Secondary | ICD-10-CM

## 2011-08-19 DIAGNOSIS — Z113 Encounter for screening for infections with a predominantly sexual mode of transmission: Secondary | ICD-10-CM

## 2011-08-19 DIAGNOSIS — Z331 Pregnant state, incidental: Secondary | ICD-10-CM

## 2011-08-19 DIAGNOSIS — Z3490 Encounter for supervision of normal pregnancy, unspecified, unspecified trimester: Secondary | ICD-10-CM

## 2011-08-19 NOTE — Progress Notes (Signed)
C/o pressure FKCs and Labor Precautions I do not rec chiropractor GBS/GC/CT today with consent

## 2011-08-19 NOTE — Addendum Note (Signed)
Addended by: Marla Roe A on: 08/19/2011 02:49 PM   Modules accepted: Orders

## 2011-08-20 LAB — GC/CHLAMYDIA PROBE AMP, GENITAL
Chlamydia, DNA Probe: NEGATIVE
GC Probe Amp, Genital: NEGATIVE

## 2011-08-21 LAB — STREP B DNA PROBE: GBSP: NEGATIVE

## 2011-08-26 ENCOUNTER — Encounter: Payer: Self-pay | Admitting: Obstetrics and Gynecology

## 2011-08-26 ENCOUNTER — Ambulatory Visit (INDEPENDENT_AMBULATORY_CARE_PROVIDER_SITE_OTHER): Payer: BC Managed Care – PPO | Admitting: Obstetrics and Gynecology

## 2011-08-26 VITALS — BP 120/82 | Wt 255.0 lb

## 2011-08-26 DIAGNOSIS — Z331 Pregnant state, incidental: Secondary | ICD-10-CM

## 2011-08-26 DIAGNOSIS — Z3490 Encounter for supervision of normal pregnancy, unspecified, unspecified trimester: Secondary | ICD-10-CM

## 2011-08-26 NOTE — Progress Notes (Signed)
Doing well. Labor s/s reviewed. Mild HA, but no other s/s--not sleeping well.  Tips reviewed, may try Benadryl 50 mg po hs.

## 2011-08-26 NOTE — Progress Notes (Signed)
C/o HA haven't tried any meds

## 2011-08-28 LAB — US OB FOLLOW UP

## 2011-09-02 ENCOUNTER — Encounter: Payer: BC Managed Care – PPO | Admitting: Obstetrics and Gynecology

## 2011-09-02 ENCOUNTER — Encounter: Payer: Self-pay | Admitting: Obstetrics and Gynecology

## 2011-09-02 ENCOUNTER — Ambulatory Visit (INDEPENDENT_AMBULATORY_CARE_PROVIDER_SITE_OTHER): Payer: BC Managed Care – PPO | Admitting: Obstetrics and Gynecology

## 2011-09-02 ENCOUNTER — Ambulatory Visit (INDEPENDENT_AMBULATORY_CARE_PROVIDER_SITE_OTHER): Payer: BC Managed Care – PPO

## 2011-09-02 VITALS — BP 130/88 | Wt 255.0 lb

## 2011-09-02 DIAGNOSIS — O4690 Antepartum hemorrhage, unspecified, unspecified trimester: Secondary | ICD-10-CM

## 2011-09-02 NOTE — Patient Instructions (Signed)
Normal Labor and Delivery Your caregiver must first be sure you are in labor. Signs of labor include:  You may pass what is called "the mucus plug" before labor begins. This is a small amount of blood stained mucus.   Regular uterine contractions.   The time between contractions get closer together.   The discomfort and pain gradually gets more intense.   Pains are mostly located in the back.   Pains get worse when walking.   The cervix (the opening of the uterus becomes thinner (begins to efface) and opens up (dilates).  Once you are in labor and admitted into the hospital or care center, your caregiver will do the following:  A complete physical examination.   Check your vital signs (blood pressure, pulse, temperature and the fetal heart rate).   Do a vaginal examination (using a sterile glove and lubricant) to determine:   The position (presentation) of the baby (head [vertex] or buttock first).   The level (station) of the baby's head in the birth canal.   The effacement and dilatation of the cervix.   You may have your pubic hair shaved and be given an enema depending on your caregiver and the circumstance.   An electronic monitor is usually placed on your abdomen. The monitor follows the length and intensity of the contractions, as well as the baby's heart rate.   Usually, your caregiver will insert an IV in your arm with a bottle of sugar water. This is done as a precaution so that medications can be given to you quickly during labor or delivery.  NORMAL LABOR AND DELIVERY IS DIVIDED UP INTO 3 STAGES: First Stage This is when regular contractions begin and the cervix begins to efface and dilate. This stage can last from 3 to 15 hours. The end of the first stage is when the cervix is 100% effaced and 10 centimeters dilated. Pain medications may be given by   Injection (morphine, demerol, etc.)   Regional anesthesia (spinal, caudal or epidural, anesthetics given in  different locations of the spine). Paracervical pain medication may be given, which is an injection of and anesthetic on each side of the cervix.  A pregnant woman may request to have "Natural Childbirth" which is not to have any medications or anesthesia during her labor and delivery. Second Stage This is when the baby comes down through the birth canal (vagina) and is born. This can take 1 to 4 hours. As the baby's head comes down through the birth canal, you may feel like you are going to have a bowel movement. You will get the urge to bear down and push until the baby is delivered. As the baby's head is being delivered, the caregiver will decide if an episiotomy (a cut in the perineum and vagina area) is needed to prevent tearing of the tissue in this area. The episiotomy is sewn up after the delivery of the baby and placenta. Sometimes a mask with nitrous oxide is given for the mother to breath during the delivery of the baby to help if there is too much pain. The end of Stage 2 is when the baby is fully delivered. Then when the umbilical cord stops pulsating it is clamped and cut. Third Stage The third stage begins after the baby is completely delivered and ends after the placenta (afterbirth) is delivered. This usually takes 5 to 30 minutes. After the placenta is delivered, a medication is given either by intravenous or injection to help contract   the uterus and prevent bleeding. The third stage is not painful and pain medication is usually not necessary. If an episiotomy was done, it is repaired at this time. After the delivery, the mother is watched and monitored closely for 1 to 2 hours to make sure there is no postpartum bleeding (hemorrhage). If there is a lot of bleeding, medication is given to contract the uterus and stop the bleeding. Document Released: 12/03/2007 Document Revised: 02/12/2011 Document Reviewed: 12/03/2007 Exit[employer] Patient Information 2012 [employer]. 

## 2011-09-02 NOTE — Progress Notes (Signed)
Pt c/o spotting starting this morning. Pt also states she had pain early this morning that woke her up and pressure. Light brown discharge.

## 2011-09-02 NOTE — Progress Notes (Signed)
  Subjective:    Patient ID: Krista George, female    DOB: 09-26-1975, 36 y.o.   MRN: [id number]  HPI pt reports some spotting this morning.  She believes she is having contractions    Review of Systems     Objective:   Physical Exam Speculum  Blood tinged mucus with watery discharge lnitrizene and ferning both negative cx 2-3/50/-1       Assessment & Plan:  Check BPP with AFI for watery discharge.   BPP 8/8 AFI 8.69 Labor reviewed with pt she was offered to go to L&D for recheck in one hour but declined Repeat BP 128/88 PIH precautions given FKC reviewed

## 2011-09-03 ENCOUNTER — Telehealth: Payer: Self-pay | Admitting: Obstetrics and Gynecology

## 2011-09-03 ENCOUNTER — Encounter (HOSPITAL_COMMUNITY): Payer: Self-pay | Admitting: Anesthesiology

## 2011-09-03 ENCOUNTER — Encounter (HOSPITAL_COMMUNITY): Payer: Self-pay | Admitting: *Deleted

## 2011-09-03 ENCOUNTER — Inpatient Hospital Stay (HOSPITAL_COMMUNITY): Payer: BC Managed Care – PPO | Admitting: Anesthesiology

## 2011-09-03 ENCOUNTER — Inpatient Hospital Stay (HOSPITAL_COMMUNITY)
Admission: AD | Admit: 2011-09-03 | Discharge: 2011-09-05 | DRG: 373 | Disposition: A | Discharge status: Home / Self Care | Payer: BC Managed Care – PPO | Source: Ambulatory Visit | Attending: Obstetrics and Gynecology | Admitting: Obstetrics and Gynecology

## 2011-09-03 DIAGNOSIS — O99892 Other specified diseases and conditions complicating childbirth: Secondary | ICD-10-CM | POA: Diagnosis present

## 2011-09-03 DIAGNOSIS — O9902 Anemia complicating childbirth: Secondary | ICD-10-CM | POA: Diagnosis present

## 2011-09-03 DIAGNOSIS — D649 Anemia, unspecified: Secondary | ICD-10-CM | POA: Diagnosis present

## 2011-09-03 DIAGNOSIS — IMO0002 Reserved for concepts with insufficient information to code with codable children: Secondary | ICD-10-CM

## 2011-09-03 DIAGNOSIS — S3141XA Laceration without foreign body of vagina and vulva, initial encounter: Secondary | ICD-10-CM | POA: Diagnosis not present

## 2011-09-03 DIAGNOSIS — R030 Elevated blood-pressure reading, without diagnosis of hypertension: Secondary | ICD-10-CM | POA: Diagnosis present

## 2011-09-03 LAB — CBC
HCT: 33.6 % — ABNORMAL LOW (ref 36.0–46.0)
Hemoglobin: 11.4 g/dL — ABNORMAL LOW (ref 12.0–15.0)
MCH: 29.4 pg (ref 26.0–34.0)
MCHC: 33.9 g/dL (ref 30.0–36.0)
MCV: 86.6 fL (ref 78.0–100.0)
Platelets: 250 10*3/uL (ref 150–400)
RBC: 3.88 MIL/uL (ref 3.87–5.11)
RDW: 13.7 % (ref 11.5–15.5)
WBC: 12.1 10*3/uL — ABNORMAL HIGH (ref 4.0–10.5)

## 2011-09-03 LAB — RPR: RPR Ser Ql: NONREACTIVE

## 2011-09-03 LAB — ABO/RH: ABO/RH(D): O POS

## 2011-09-03 LAB — TYPE AND SCREEN
ABO/RH(D): O POS
Antibody Screen: NEGATIVE

## 2011-09-03 MED ORDER — DIBUCAINE 1 % RE OINT: 1.0000 "application " | TOPICAL_OINTMENT | RECTAL | Status: DC | PRN

## 2011-09-03 MED ORDER — LACTATED RINGERS IV SOLN
INTRAVENOUS | Status: DC
  Administered 2011-09-03 (×2): via INTRAVENOUS
  Administered 2011-09-03: 125 mL/h via INTRAVENOUS

## 2011-09-03 MED ORDER — EPHEDRINE 5 MG/ML INJ: 10.0000 mg | INTRAVENOUS | Status: DC | PRN

## 2011-09-03 MED ORDER — WITCH HAZEL-GLYCERIN EX PADS
1.0000 "application " | MEDICATED_PAD | CUTANEOUS | Status: DC | PRN
  Administered 2011-09-05: 1 via TOPICAL

## 2011-09-03 MED ORDER — PHENYLEPHRINE 40 MCG/ML (10ML) SYRINGE FOR IV PUSH (FOR BLOOD PRESSURE SUPPORT): 80.0000 ug | PREFILLED_SYRINGE | INTRAVENOUS | Status: DC | PRN

## 2011-09-03 MED ORDER — OXYTOCIN 40 UNITS IN LACTATED RINGERS INFUSION - SIMPLE MED
1.0000 m[IU]/min | INTRAVENOUS | Status: DC
  Administered 2011-09-03: 2 m[IU]/min via INTRAVENOUS

## 2011-09-03 MED ORDER — TERBUTALINE SULFATE 1 MG/ML IJ SOLN: 0.2500 mg | Freq: Once | INTRAMUSCULAR | Status: DC | PRN

## 2011-09-03 MED ORDER — EPHEDRINE 5 MG/ML INJ
10.0000 mg | INTRAVENOUS | Status: DC | PRN
  Filled 2011-09-03: qty 4

## 2011-09-03 MED ORDER — ONDANSETRON HCL 4 MG/2ML IJ SOLN: 4.0000 mg | INTRAMUSCULAR | Status: DC | PRN

## 2011-09-03 MED ORDER — OXYTOCIN 40 UNITS IN LACTATED RINGERS INFUSION - SIMPLE MED
62.5000 mL/h | Freq: Once | INTRAVENOUS | Status: AC
  Administered 2011-09-03: 62.5 mL/h via INTRAVENOUS
  Filled 2011-09-03: qty 1000

## 2011-09-03 MED ORDER — SIMETHICONE 80 MG PO CHEW
80.0000 mg | CHEWABLE_TABLET | ORAL | Status: DC | PRN
  Administered 2011-09-05: 80 mg via ORAL

## 2011-09-03 MED ORDER — LACTATED RINGERS IV SOLN
500.0000 mL | Freq: Once | INTRAVENOUS | Status: DC
Start: 2011-09-03 — End: 2011-09-03

## 2011-09-03 MED ORDER — CITRIC ACID-SODIUM CITRATE 334-500 MG/5ML PO SOLN: 30.0000 mL | ORAL | Status: DC | PRN

## 2011-09-03 MED ORDER — LIDOCAINE HCL (PF) 1 % IJ SOLN
30.0000 mL | INTRAMUSCULAR | Status: DC | PRN
  Administered 2011-09-03: 30 mL via SUBCUTANEOUS
  Filled 2011-09-03: qty 30

## 2011-09-03 MED ORDER — ONDANSETRON HCL 4 MG PO TABS: 4.0000 mg | ORAL_TABLET | ORAL | Status: DC | PRN

## 2011-09-03 MED ORDER — DIPHENHYDRAMINE HCL 25 MG PO CAPS: 25.0000 mg | ORAL_CAPSULE | Freq: Four times a day (QID) | ORAL | Status: DC | PRN

## 2011-09-03 MED ORDER — LANOLIN HYDROUS EX OINT: TOPICAL_OINTMENT | CUTANEOUS | Status: DC | PRN

## 2011-09-03 MED ORDER — FENTANYL 2.5 MCG/ML BUPIVACAINE 1/10 % EPIDURAL INFUSION (WH - ANES)
INTRAMUSCULAR | Status: DC | PRN
  Administered 2011-09-03: 14 mL/h via EPIDURAL

## 2011-09-03 MED ORDER — TETANUS-DIPHTH-ACELL PERTUSSIS 5-2.5-18.5 LF-MCG/0.5 IM SUSP
0.5000 mL | Freq: Once | INTRAMUSCULAR | Status: AC
  Administered 2011-09-04: 0.5 mL via INTRAMUSCULAR
  Filled 2011-09-03: qty 0.5

## 2011-09-03 MED ORDER — ZOLPIDEM TARTRATE 5 MG PO TABS: 5.0000 mg | ORAL_TABLET | Freq: Every evening | ORAL | Status: DC | PRN

## 2011-09-03 MED ORDER — SODIUM BICARBONATE 8.4 % IV SOLN
INTRAVENOUS | Status: DC | PRN
  Administered 2011-09-03: 4 mL via EPIDURAL

## 2011-09-03 MED ORDER — FENTANYL 2.5 MCG/ML BUPIVACAINE 1/10 % EPIDURAL INFUSION (WH - ANES)
14.0000 mL/h | INTRAMUSCULAR | Status: DC
  Administered 2011-09-03 (×3): 14 mL/h via EPIDURAL
  Filled 2011-09-03 (×4): qty 60

## 2011-09-03 MED ORDER — ONDANSETRON HCL 4 MG/2ML IJ SOLN: 4.0000 mg | Freq: Four times a day (QID) | INTRAMUSCULAR | Status: DC | PRN

## 2011-09-03 MED ORDER — PHENYLEPHRINE 40 MCG/ML (10ML) SYRINGE FOR IV PUSH (FOR BLOOD PRESSURE SUPPORT)
80.0000 ug | PREFILLED_SYRINGE | INTRAVENOUS | Status: DC | PRN
  Filled 2011-09-03: qty 5

## 2011-09-03 MED ORDER — ACETAMINOPHEN 500 MG PO TABS
1000.0000 mg | ORAL_TABLET | Freq: Four times a day (QID) | ORAL | Status: DC | PRN
  Administered 2011-09-04: 1000 mg via ORAL
  Filled 2011-09-03: qty 2

## 2011-09-03 MED ORDER — FLEET ENEMA 7-19 GM/118ML RE ENEM: 1.0000 | ENEMA | RECTAL | Status: DC | PRN

## 2011-09-03 MED ORDER — ACETAMINOPHEN 325 MG PO TABS: 650.0000 mg | ORAL_TABLET | ORAL | Status: DC | PRN

## 2011-09-03 MED ORDER — BUTORPHANOL TARTRATE 2 MG/ML IJ SOLN: 1.0000 mg | INTRAMUSCULAR | Status: DC | PRN

## 2011-09-03 MED ORDER — PRENATAL MULTIVITAMIN CH
1.0000 | ORAL_TABLET | Freq: Every day | ORAL | Status: DC
  Administered 2011-09-04 – 2011-09-05 (×2): 1 via ORAL
  Filled 2011-09-03 (×2): qty 1

## 2011-09-03 MED ORDER — OXYTOCIN BOLUS FROM INFUSION
250.0000 mL | Freq: Once | INTRAVENOUS | Status: AC
  Administered 2011-09-03: 250 mL via INTRAVENOUS
  Filled 2011-09-03: qty 500

## 2011-09-03 MED ORDER — LACTATED RINGERS IV SOLN: 500.0000 mL | INTRAVENOUS | Status: DC | PRN

## 2011-09-03 MED ORDER — BENZOCAINE-MENTHOL 20-0.5 % EX AERO: 1.0000 "application " | INHALATION_SPRAY | CUTANEOUS | Status: DC | PRN

## 2011-09-03 MED ORDER — SENNOSIDES-DOCUSATE SODIUM 8.6-50 MG PO TABS
2.0000 | ORAL_TABLET | Freq: Every day | ORAL | Status: DC
  Administered 2011-09-03 – 2011-09-04 (×2): 2 via ORAL

## 2011-09-03 MED ORDER — DIPHENHYDRAMINE HCL 50 MG/ML IJ SOLN: 12.5000 mg | INTRAMUSCULAR | Status: DC | PRN

## 2011-09-03 MED ORDER — OXYCODONE-ACETAMINOPHEN 5-325 MG PO TABS: 1.0000 | ORAL_TABLET | ORAL | Status: DC | PRN

## 2011-09-03 MED ORDER — OXYCODONE-ACETAMINOPHEN 5-325 MG PO TABS
1.0000 | ORAL_TABLET | ORAL | Status: DC | PRN
  Administered 2011-09-03 – 2011-09-05 (×4): 1 via ORAL
  Filled 2011-09-03 (×5): qty 1

## 2011-09-03 NOTE — Progress Notes (Signed)
Comfortable," some pain on R cheeK" O VSS      fhts category 1      abd soft between uc      Contractions 2-3 mild to mod      Vag 4 100 -1/0 VTX R no fluid seen A active labor P continue care Lavera Guise, CNM

## 2011-09-03 NOTE — Anesthesia Procedure Notes (Signed)

## 2011-09-03 NOTE — Progress Notes (Signed)
  Subjective: Feeling some frontal pressure, no urge to push.  Objective: BP 123/70  Pulse 87  Temp 98 F (36.7 C) (Oral)  Resp 20  Ht 5\' 6"  (1.676 m)  Wt 255 lb (115.667 kg)  BMI 41.16 kg/m2  SpO2 97%  LMP 11/11/2010      FHT:  Category 1 UC:   regular, every 2-3 minutes SVE:   Dilation: 6.5 Effacement (%): 100 Station: 0;-1 Exam by:: Tija Biss, cNM  Assessment / Plan: Active labor, appropriate progression Will CTO. No augmentation or internal monitoring needed at present.  Shelbi Vaccaro 09/03/2011, 10:09 AM

## 2011-09-03 NOTE — Progress Notes (Signed)
  Subjective: Comfortable, no rectal pressure.  Objective: BP 138/88  Pulse 101  Temp 98.3 F (36.8 C) (Oral)  Resp 20  Ht 5\' 6"  (1.676 m)  Wt 255 lb (115.667 kg)  BMI 41.16 kg/m2  SpO2 97%  LMP 11/11/2010      FHT:  Category 1 UC:   regular, every 2-4 minutes SVE:   Dilation: 10 Effacement (%): 100 Station: +1 Exam by:: Kaj Vasil,CNM No descent with trial push, but good dimensions of pelvis   Assessment / Plan: 2nd stage labor Will labor down, and await urge to push.  Nigel Bridgeman 09/03/2011, 1:27 PM

## 2011-09-03 NOTE — Anesthesia Preprocedure Evaluation (Signed)

## 2011-09-03 NOTE — Telephone Encounter (Signed)
duscussed comfort measures, s/s labor, bleeding, srom, DFKC to report, and now plans to come for evaluation, deep breathing with uc. Lavera Guise, CNM

## 2011-09-03 NOTE — MAU Note (Signed)
Pt G1 at 38wks leaking fluid since 0250 and having contractions.

## 2011-09-03 NOTE — H&P (Signed)
Krista George is a 36 y.o. female presenting for "UC hurting in front water broke when I got off the telephone with you, with bloody show, +FM. Ready for epidural, no HSV symptoms.." Denies headache, visual spots or blurring, no epigastric pain, no edema to hands, face, ankles. Started care at 9 weeks, dating by certain LMP,, US anatomy WNL with EIF resolved, posterior placenta, 5/29 [place] growth 81%, 6/26 AFI 8 BPP 8/8 Problem list: HSV EIF resolved Tachycardia with atypical ECG had ecchyo, wore monitor  Meds: valtrex 500 mg po daily, PNV History OB History    Grav Para Term Preterm Abortions TAB SAB Ect Mult Living   1 0 0 0 0 0 0 0 0 0      Past Medical History  Diagnosis Date  . HSV-2 infection 2011    "no outbreaks ever"  . Abnormal Pap smear 2003  . Herpes   . H/O candidiasis   . H/O cytomegalovirus infection   . Anemia     As a child   . Hx: UTI (urinary tract infection)      x 1  . Kidney stone   . Stomach ulcer   . Vulvar lesion 2009  . Blood transfusion 10/2003    Needed   3 units    Past Surgical History  Procedure Date  . Tonsillectomy 1986  . Eye surgery 2000    LASIX  . Knee surgery 2008  . Wisdom tooth extraction     36 yrs old    Family History: family history includes COPD in her maternal grandfather; Depression in her mother; Hypertension in her mother; Kidney disease in her paternal aunt and sister; and Mental illness in her mother. Social History:  reports that she has never smoked. She has never used smokeless tobacco. She reports that she does not drink alcohol or use illicit drugs.   Prenatal Transfer Tool  done see chart              ROS  LMP 11/11/2010  Dilation: 3 Effacement (%): 100 Station: -1 Exam by:: Hassell Halim CNM Fhts category 1 accels uc q 1 1/2-2 mild to mod Last menstrual period 11/11/2010. Exam Physical Exam Calm, no distress, HEENT WNL, breasts bilaterally no dimpling, masses, or drainage lungs clear bilaterally, AP RRR,  abd soft nt,no masses, not tympanic bowel sounds active, abdomen nontender, Normal hair distrubition mons pubis,  EGBUS WNL,  sterile speculum exam  vagina pink, moist normal rugae,  No HSV lesions noted No amniotic fluid seen fetal hair visible and palpable No edema to lower extremities Prenatal labs: ABO, Rh:  ) postitive Antibody:  neg Rubella:  Immune RPR: NON REAC (04/09 1514)  HBsAg:   Neg HIV:   Neg GBS: NEGATIVE (06/12 1451)   Assessment/Plan: Early labor, SROM 38 week IUP P epidural, routine admission, collaboration with Dr. Normand Sloop.  Krista George 09/03/2011, 3:39 AM

## 2011-09-03 NOTE — Progress Notes (Signed)
  Subjective: Doing well.  Aware of pressure on right side, but no pain s/p epidural.  Leaking clear fluid.  Objective: BP 131/106  Pulse 104  Temp 98.6 F (37 C) (Oral)  Resp 20  Ht 5\' 6"  (1.676 m)  Wt 255 lb (115.667 kg)  BMI 41.16 kg/m2  SpO2 97%  LMP 11/11/2010   Filed Vitals:   09/03/11 0602 09/03/11 0634 09/03/11 0702 09/03/11 0732  BP: 139/78 138/75 128/75 131/106  Pulse: 92 96 97 104  Temp:  98.6 F (37 C)    TempSrc:  Oral    Resp: 18 18 18 20   Height:      Weight:      SpO2:           FHT: Category 1 UC:   q 2-3 min. SVE:   Dilation: 4 Effacement (%): 100 Station: 0;-1 Exam by:: Hassell Halim CNM at 6:30am  Labs: Lab Results  Component Value Date   WBC 12.1* 09/03/2011   HGB 11.4* 09/03/2011   HCT 33.6* 09/03/2011   MCV 86.6 09/03/2011   PLT 250 09/03/2011    Assessment / Plan: Spontaneous labor Will CTO--plan re-evaluation by 9am. IUPC and augmentation prn.   Krista George 09/03/2011, 8:00 AM

## 2011-09-03 NOTE — Progress Notes (Signed)
  Subjective: Feeling no urge to push--aware of contractions as tightening in fundus.  Objective: BP 153/91  Pulse 112  Temp 97.5 F (36.4 C) (Oral)  Resp 20  Ht 5\' 6"  (1.676 m)  Wt 255 lb (115.667 kg)  BMI 41.16 kg/m2  SpO2 97%  LMP 11/11/2010      FHT:  Category 1 UC:   q 5 min, mild to palpation, with minimal thrust with contractions. SVE:   Dilation: 10 Effacement (%): 100 Station: +1 Exam by:: Emilee Hero, CNM   Assessment / Plan: Inadequate contraction quality Will start pitocin augmentation.  Nigel Bridgeman 09/03/2011, 2:48 PM

## 2011-09-04 LAB — COMPREHENSIVE METABOLIC PANEL
ALT: 14 U/L (ref 0–35)
AST: 15 U/L (ref 0–37)
Albumin: 2 g/dL — ABNORMAL LOW (ref 3.5–5.2)
Alkaline Phosphatase: 91 U/L (ref 39–117)
BUN: 8 mg/dL (ref 6–23)
CO2: 27 mEq/L (ref 19–32)
Calcium: 8.6 mg/dL (ref 8.4–10.5)
Chloride: 104 mEq/L (ref 96–112)
Creatinine, Ser: 0.65 mg/dL (ref 0.50–1.10)
GFR calc Af Amer: >90 mL/min (ref >90)
GFR calc non Af Amer: >90 mL/min (ref >90)
Glucose, Bld: 96 mg/dL (ref 70–99)
Potassium: 3.8 mEq/L (ref 3.5–5.1)
Sodium: 137 mEq/L (ref 135–145)
Total Bilirubin: 0.1 mg/dL — ABNORMAL LOW (ref 0.3–1.2)
Total Protein: 4.5 g/dL — ABNORMAL LOW (ref 6.0–8.3)

## 2011-09-04 LAB — CBC WITH DIFFERENTIAL/PLATELET
Basophils Absolute: 0 10*3/uL (ref 0.0–0.1)
Basophils Relative: 0 % (ref 0–1)
Eosinophils Absolute: 0.2 10*3/uL (ref 0.0–0.7)
Eosinophils Relative: 2 % (ref 0–5)
HCT: 26.4 % — ABNORMAL LOW (ref 36.0–46.0)
Hemoglobin: 9 g/dL — ABNORMAL LOW (ref 12.0–15.0)
Lymphocytes Relative: 21 % (ref 12–46)
Lymphs Abs: 2.3 10*3/uL (ref 0.7–4.0)
MCH: 30.1 pg (ref 26.0–34.0)
MCHC: 34.1 g/dL (ref 30.0–36.0)
MCV: 88.3 fL (ref 78.0–100.0)
Monocytes Absolute: 0.9 10*3/uL (ref 0.1–1.0)
Monocytes Relative: 8 % (ref 3–12)
Neutro Abs: 7.7 10*3/uL (ref 1.7–7.7)
Neutrophils Relative %: 69 % (ref 43–77)
Platelets: 214 10*3/uL (ref 150–400)
RBC: 2.99 MIL/uL — ABNORMAL LOW (ref 3.87–5.11)
RDW: 14.2 % (ref 11.5–15.5)
WBC: 11.2 10*3/uL — ABNORMAL HIGH (ref 4.0–10.5)

## 2011-09-04 LAB — CBC
HCT: 26.5 % — ABNORMAL LOW (ref 36.0–46.0)
Hemoglobin: 9 g/dL — ABNORMAL LOW (ref 12.0–15.0)
MCH: 29.7 pg (ref 26.0–34.0)
MCHC: 33.6 g/dL (ref 30.0–36.0)
MCV: 88.3 fL (ref 78.0–100.0)
Platelets: 199 10*3/uL (ref 150–400)
RBC: 3 MIL/uL — ABNORMAL LOW (ref 3.87–5.11)
RDW: 14 % (ref 11.5–15.5)
WBC: 13.1 10*3/uL — ABNORMAL HIGH (ref 4.0–10.5)

## 2011-09-04 LAB — URIC ACID: Uric Acid, Serum: 4.4 mg/dL (ref 2.4–7.0)

## 2011-09-04 LAB — LACTATE DEHYDROGENASE: LDH: 154 U/L (ref 94–250)

## 2011-09-04 MED ORDER — FERROUS SULFATE 325 (65 FE) MG PO TABS
325.0000 mg | ORAL_TABLET | Freq: Every day | ORAL | Status: DC
  Administered 2011-09-05: 325 mg via ORAL
  Filled 2011-09-04: qty 1

## 2011-09-04 NOTE — Progress Notes (Signed)
Post Partum Day 1--s/p SVB, baby in NICU for tachypnea, undergoing sepsis w/u Subjective: Doing well.  Up ad lib without syncope or dizziness.  Pumping for breastmilk.  Baby transferred to NICU during night for tachypnea--on ATB.  Patient anticipates a 3-7 day stay, based on labs.  Understands she will go home before the baby, and is coping with that well.  Plans Micronor.  Perineum sore, but pain medication and ice pack working well.  Objective: Blood pressure 137/88, pulse 74, temperature 97.9 F (36.6 C), temperature source Oral, resp. rate 18, height 5\' 6"  (1.676 m), weight 255 lb (115.667 kg), last menstrual period 11/11/2010, SpO2 97.00%, unknown if currently breastfeeding.  Filed Vitals:   09/03/11 2005 09/04/11 0010 09/04/11 0621 09/04/11 0830  BP: 129/86 121/85 143/88 137/88  Pulse: 99 98 94 74  Temp: 98.9 F (37.2 C) 98.7 F (37.1 C) 97.9 F (36.6 C)   TempSrc: Oral Oral Oral   Resp: 18 18 18    Height:      Weight:      SpO2:       Baseline BPs during pregnancy have been 120s-130s/70s-80s Weight on 6/26 = 255  Physical Exam:  General: alert Lochia: appropriate Uterine Fundus: firm Incision: healing well DVT Evaluation: No evidence of DVT seen on physical exam. Negative Homan's sign.  1+ edema, stable from before delivery.   Basename 09/04/11 0535 09/03/11 0335  HGB 9.0* 11.4*  HCT 26.5* 33.6*    Assessment/Plan: Plan for discharge tomorrow Support to patient for NICU infant status Sitz bath to patient 24 hours after delivery. CTO BP Check orthostatics Recommend Fe Daily weights   LOS: 1 day   Makhi Muzquiz 09/04/2011, 8:59 AM

## 2011-09-04 NOTE — Clinical Social Work Maternal (Signed)
    Clinical Social Work Department PSYCHOSOCIAL ASSESSMENT - MATERNAL/CHILD 09/04/2011  Patient:  Krista George, Krista George  Account Number:  [id number]  Admit Date:  09/03/2011  Marjo Bicker Name:   Krista George    Clinical Social Worker:  Krista Riding, LCSW   Date/Time:  09/04/2011 10:00 AM  Date Referred:  09/04/2011   Referral source  NICU     Referred reason  NICU   Other referral source:    I:  FAMILY / HOME ENVIRONMENT Child's legal guardian:  PARENT  Guardian - Name Guardian - Age Guardian - Address  Krista George 35 On file  [employer]  same   Other household support members/support persons Other support:   Good support system although MOB's family lives in [place] and FOB's family lives in [place].    II  PSYCHOSOCIAL DATA Information Source:  Family Interview  Financial and [employer] Employment:   MOB works at [employer] and has at least 6 weeks off for maternity leave.  FOB is a Acupuncturist and works 7-3, M-F.   Financial resources:  Media planner If [employer] - [place]:    School / Grade:   Maternity Care Coordinator / Child Services Coordination / Early Interventions:  Cultural issues impacting care:   none known    III  STRENGTHS Strengths  Adequate Resources  Compliance with medical plan  Home prepared for Child (including basic supplies)  Other - See comment  Supportive family/friends  Understanding of illness   Strength comment:  Baby will go to [facility] for follow up at discharge.   IV  RISK FACTORS AND CURRENT PROBLEMS Current Problem:  None   Risk Factor & Current Problem Patient Issue Family Issue Risk Factor / Current Problem Comment   N N     V  SOCIAL WORK ASSESSMENT SW met with parents in MOB's first floor room to introduce myself, complete assessment and evaluate how they are coping with baby's admission to NICU.  Parents were very pleasant and state that they are doing much George today, now that baby is doing George.   They report having everything they need for baby at home and a good support system.  They seem to be coping appropriately, and state no questions or concerns at this time.  SW explained support services offered by NICU SW and gave contact information. SW discussed common emotions related to NICU admission and signs and symptoms of PPD.  MOB states she feels comfortable talking with her doctor if symptoms arise.  FOB appears very supportive to MOB.  SW gave "Feelings After Birth" handout and has no social concerns at this time.      VI SOCIAL WORK PLAN Social Work Plan  Psychosocial Support/Ongoing Assessment of Needs   Type of pt/family education:   Signs and symptoms of PPD  What to expect from the NICU experience   If child protective services report - county:   If child protective services report - date:   Information/referral to community resources comment:   PPD support group information   Other social work plan:

## 2011-09-04 NOTE — Anesthesia Postprocedure Evaluation (Signed)
  Anesthesia Post-op Note  Patient: Krista George  Procedure(s) Performed: * No procedures listed *  Patient Location: Mother/Baby  Anesthesia Type: Epidural  Level of Consciousness: awake, alert  and oriented  Airway and Oxygen Therapy: Patient Spontanous Breathing  Post-op Pain: none  Post-op Assessment: Post-op Vital signs reviewed and Patient's Cardiovascular Status Stable  Post-op Vital Signs: Reviewed and stable  Complications: No apparent anesthesia complications

## 2011-09-05 LAB — URINALYSIS, ROUTINE W REFLEX MICROSCOPIC
Bilirubin Urine: NEGATIVE
Glucose, UA: NEGATIVE mg/dL
Ketones, ur: NEGATIVE mg/dL
Nitrite: POSITIVE — AB
Protein, ur: NEGATIVE mg/dL
Specific Gravity, Urine: 1.015 (ref 1.005–1.030)
Urobilinogen, UA: 0.2 mg/dL (ref 0.0–1.0)
pH: 7 (ref 5.0–8.0)

## 2011-09-05 LAB — URINE MICROSCOPIC-ADD ON

## 2011-09-05 MED ORDER — FERROUS SULFATE 325 (65 FE) MG PO TABS: 325.0000 mg | ORAL_TABLET | Freq: Every day | ORAL | Status: DC

## 2011-09-05 MED ORDER — OXYCODONE-ACETAMINOPHEN 5-325 MG PO TABS: 1.0000 | ORAL_TABLET | ORAL | Status: AC | PRN

## 2011-09-05 MED ORDER — NORETHINDRONE 0.35 MG PO TABS: 1.0000 | ORAL_TABLET | Freq: Every day | ORAL | Status: DC

## 2011-09-05 NOTE — Progress Notes (Addendum)
Post Partum Day 2 Subjective: Reports feeling well.  Ambulating, voiding and tol po liquids and solids without difficulty.  Denies weakness or dizziness.  Denies headache, vision chgs, RUQ pain.  Pos flatus, pos BM.  Reports perineal pain is mild and well controlled with meds.  Pumping breastmilk without difficulty.  Reports infant is doing well in NICU and improving.    Objective: Blood pressure 129/79, pulse 84, temperature 97.9 F (36.6 C), temperature source Oral, resp. rate 18, height 5\' 6"  (1.676 m), weight 112.662 kg (248 lb 6 oz), last menstrual period 11/11/2010, SpO2 97.00%, unknown if currently breastfeeding. Filed Vitals:   09/04/11 1717 09/04/11 2010 09/05/11 0618 09/05/11 0652  BP: 138/90 115/78 129/79   Pulse: 82 78 84   Temp: 98 F (36.7 C) 98 F (36.7 C) 97.9 F (36.6 C)   TempSrc: Oral Oral Oral   Resp: 18 18 18    Height:      Weight:    112.662 kg (248 lb 6 oz)  SpO2: 97%      Filed Weights   09/03/11 0415 09/04/11 1140 09/05/11 0652  Weight: 115.667 kg (255 lb) 111.766 kg (246 lb 6.4 oz) 112.662 kg (248 lb 6 oz)   Physical Exam:  General: alert, cooperative and no distress Heart:  RRR Lungs:  CTA bilat Abd:  Soft, NT with pos BS x 4 quads Lochia: appropriate, scant rubra Uterine Fundus: firm, NT 1 below umb Incision: healing well, perineum intact DVT Evaluation: No evidence of DVT seen on physical exam. Negative Homan's sign bilat No significant calf/ankle edema.  Tr edema present. DTRs 1+, no clonus  Results for orders placed during the hospital encounter of 09/03/11 (from the past 24 hour(s))  CBC WITH DIFFERENTIAL     Status: Abnormal   Collection Time   09/04/11  5:40 PM      Component Value Range   WBC 11.2 (*) 4.0 - 10.5 K/uL   RBC 2.99 (*) 3.87 - 5.11 MIL/uL   Hemoglobin 9.0 (*) 12.0 - 15.0 g/dL   HCT 91.4 (*) 78.2 - 95.6 %   MCV 88.3  78.0 - 100.0 fL   MCH 30.1  26.0 - 34.0 pg   MCHC 34.1  30.0 - 36.0 g/dL   RDW 21.3  08.6 - 57.8 %   Platelets 214  150 - 400 K/uL   Neutrophils Relative 69  43 - 77 %   Neutro Abs 7.7  1.7 - 7.7 K/uL   Lymphocytes Relative 21  12 - 46 %   Lymphs Abs 2.3  0.7 - 4.0 K/uL   Monocytes Relative 8  3 - 12 %   Monocytes Absolute 0.9  0.1 - 1.0 K/uL   Eosinophils Relative 2  0 - 5 %   Eosinophils Absolute 0.2  0.0 - 0.7 K/uL   Basophils Relative 0  0 - 1 %   Basophils Absolute 0.0  0.0 - 0.1 K/uL  COMPREHENSIVE METABOLIC PANEL     Status: Abnormal   Collection Time   09/04/11  5:40 PM      Component Value Range   Sodium 137  135 - 145 mEq/L   Potassium 3.8  3.5 - 5.1 mEq/L   Chloride 104  96 - 112 mEq/L   CO2 27  19 - 32 mEq/L   Glucose, Bld 96  70 - 99 mg/dL   BUN 8  6 - 23 mg/dL   Creatinine, Ser 4.69  0.50 - 1.10 mg/dL   Calcium 8.6  8.4 - 10.5 mg/dL   Total Protein 4.5 (*) 6.0 - 8.3 g/dL   Albumin 2.0 (*) 3.5 - 5.2 g/dL   AST 15  0 - 37 U/L   ALT 14  0 - 35 U/L   Alkaline Phosphatase 91  39 - 117 U/L   Total Bilirubin 0.1 (*) 0.3 - 1.2 mg/dL   GFR calc non Af Amer >90  >90 mL/min   GFR calc Af Amer >90  >90 mL/min  URIC ACID     Status: Normal   Collection Time   09/04/11  5:40 PM      Component Value Range   Uric Acid, Serum 4.4  2.4 - 7.0 mg/dL  LACTATE DEHYDROGENASE     Status: Normal   Collection Time   09/04/11  5:40 PM      Component Value Range   LDH 154  94 - 250 U/L  URINALYSIS, ROUTINE W REFLEX MICROSCOPIC     Status: Abnormal   Collection Time   09/05/11 12:49 AM      Component Value Range   Color, Urine YELLOW  YELLOW   APPearance HAZY (*) CLEAR   Specific Gravity, Urine 1.015  1.005 - 1.030   pH 7.0  5.0 - 8.0   Glucose, UA NEGATIVE  NEGATIVE mg/dL   Hgb urine dipstick LARGE (*) NEGATIVE   Bilirubin Urine NEGATIVE  NEGATIVE   Ketones, ur NEGATIVE  NEGATIVE mg/dL   Protein, ur NEGATIVE  NEGATIVE mg/dL   Urobilinogen, UA 0.2  0.0 - 1.0 mg/dL   Nitrite POSITIVE (*) NEGATIVE   Leukocytes, UA SMALL (*) NEGATIVE  URINE MICROSCOPIC-ADD ON     Status: Abnormal     Collection Time   09/05/11 12:49 AM      Component Value Range   Squamous Epithelial / LPF FEW (*) RARE   WBC, UA 11-20  <3 WBC/hpf   RBC / HPF TOO NUMEROUS TO COUNT  <3 RBC/hpf   Bacteria, UA MANY (*) RARE     Basename 09/04/11 1740 09/04/11 0535  HGB 9.0* 9.0*  HCT 26.4* 26.5*    Assessment/Plan: Stable s/p vag delivery-PP Day #2 Elevated blood pressure reading with no evidence of preeclampsia Asymptomatic anemia  Discharge to home. Discharge instructions reviewed. Rx Percocet, Iron, Micronor Revd s/s preeclampsia Smart start to f/u pt's BP early this coming week-Msg left at [facility] office.  Desires to begin Micronor for contraception.  Rx given and pt instructed to begin Sunday, 09/20/11.   LOS: 2 days   Dairon Procter O. 09/05/2011, 10:34 AM

## 2011-09-05 NOTE — Discharge Summary (Signed)
Obstetric Discharge Summary Reason for Admission: onset of labor Prenatal Procedures: ultrasound Intrapartum Procedures: spontaneous vaginal delivery Postpartum Procedures: none Complications-Operative and Postpartum: 2nd degree perineal laceration Hemoglobin  Date Value Range Status  09/04/2011 9.0* 12.0 - 15.0 g/dL Final     HCT  Date Value Range Status  09/04/2011 26.4* 36.0 - 46.0 % Final   Results for orders placed during the hospital encounter of 09/03/11 (from the past 48 hour(s))  CBC     Status: Abnormal   Collection Time   09/04/11  5:35 AM      Component Value Range Comment   WBC 13.1 (*) 4.0 - 10.5 K/uL    RBC 3.00 (*) 3.87 - 5.11 MIL/uL    Hemoglobin 9.0 (*) 12.0 - 15.0 g/dL    HCT 16.1 (*) 09.6 - 46.0 %    MCV 88.3  78.0 - 100.0 fL    MCH 29.7  26.0 - 34.0 pg    MCHC 33.6  30.0 - 36.0 g/dL    RDW 04.5  40.9 - 81.1 %    Platelets 199  150 - 400 K/uL   CBC WITH DIFFERENTIAL     Status: Abnormal   Collection Time   09/04/11  5:40 PM      Component Value Range Comment   WBC 11.2 (*) 4.0 - 10.5 K/uL    RBC 2.99 (*) 3.87 - 5.11 MIL/uL    Hemoglobin 9.0 (*) 12.0 - 15.0 g/dL    HCT 91.4 (*) 78.2 - 46.0 %    MCV 88.3  78.0 - 100.0 fL    MCH 30.1  26.0 - 34.0 pg    MCHC 34.1  30.0 - 36.0 g/dL    RDW 95.6  21.3 - 08.6 %    Platelets 214  150 - 400 K/uL    Neutrophils Relative 69  43 - 77 %    Neutro Abs 7.7  1.7 - 7.7 K/uL    Lymphocytes Relative 21  12 - 46 %    Lymphs Abs 2.3  0.7 - 4.0 K/uL    Monocytes Relative 8  3 - 12 %    Monocytes Absolute 0.9  0.1 - 1.0 K/uL    Eosinophils Relative 2  0 - 5 %    Eosinophils Absolute 0.2  0.0 - 0.7 K/uL    Basophils Relative 0  0 - 1 %    Basophils Absolute 0.0  0.0 - 0.1 K/uL   COMPREHENSIVE METABOLIC PANEL     Status: Abnormal   Collection Time   09/04/11  5:40 PM      Component Value Range Comment   Sodium 137  135 - 145 mEq/L    Potassium 3.8  3.5 - 5.1 mEq/L    Chloride 104  96 - 112 mEq/L    CO2 27  19 - 32  mEq/L    Glucose, Bld 96  70 - 99 mg/dL    BUN 8  6 - 23 mg/dL    Creatinine, Ser 5.78  0.50 - 1.10 mg/dL    Calcium 8.6  8.4 - 46.9 mg/dL    Total Protein 4.5 (*) 6.0 - 8.3 g/dL    Albumin 2.0 (*) 3.5 - 5.2 g/dL    AST 15  0 - 37 U/L    ALT 14  0 - 35 U/L    Alkaline Phosphatase 91  39 - 117 U/L    Total Bilirubin 0.1 (*) 0.3 - 1.2 mg/dL    GFR  calc non Af Amer >90  >90 mL/min    GFR calc Af Amer >90  >90 mL/min   URIC ACID     Status: Normal   Collection Time   09/04/11  5:40 PM      Component Value Range Comment   Uric Acid, Serum 4.4  2.4 - 7.0 mg/dL   LACTATE DEHYDROGENASE     Status: Normal   Collection Time   09/04/11  5:40 PM      Component Value Range Comment   LDH 154  94 - 250 U/L   URINALYSIS, ROUTINE W REFLEX MICROSCOPIC     Status: Abnormal   Collection Time   09/05/11 12:49 AM      Component Value Range Comment   Color, Urine YELLOW  YELLOW    APPearance HAZY (*) CLEAR    Specific Gravity, Urine 1.015  1.005 - 1.030    pH 7.0  5.0 - 8.0    Glucose, UA NEGATIVE  NEGATIVE mg/dL    Hgb urine dipstick LARGE (*) NEGATIVE    Bilirubin Urine NEGATIVE  NEGATIVE    Ketones, ur NEGATIVE  NEGATIVE mg/dL    Protein, ur NEGATIVE  NEGATIVE mg/dL    Urobilinogen, UA 0.2  0.0 - 1.0 mg/dL    Nitrite POSITIVE (*) NEGATIVE    Leukocytes, UA SMALL (*) NEGATIVE   URINE MICROSCOPIC-ADD ON     Status: Abnormal   Collection Time   09/05/11 12:49 AM      Component Value Range Comment   Squamous Epithelial / LPF FEW (*) RARE    WBC, UA 11-20  <3 WBC/hpf    RBC / HPF TOO NUMEROUS TO COUNT  <3 RBC/hpf    Bacteria, UA MANY (*) RARE    Filed Weights   09/03/11 0415 09/04/11 1140 09/05/11 0652  Weight: 115.667 kg (255 lb) 111.766 kg (246 lb 6.4 oz) 112.662 kg (248 lb 6 oz)    Physical Exam:  General: alert, cooperative and no distress Heart: RRR Lungs:  CTA bilat Abd:  Soft, NT, pos BS x 4 quads Lochia: appropriate, scant rubra Uterine Fundus: firm, NT 1 below umb Incision:  healing well, perineum intact DVT Evaluation: No evidence of DVT seen on physical exam. Negative Homan's sign bilat. No significant calf/ankle edema.  Tr edema present bilateral lower extrems. DTRs 1+, no clonus.  Discharge Diagnoses: Term Pregnancy-delivered        Elevated Blood Pressure without evidence of preeclampsia                                         Asymptomatic anemia Discharge Information: Date: 09/05/2011 Activity: unrestricted Diet: routine Medications: PNV, Iron, Percocet and Micronor Condition: stable Instructions: refer to practice specific booklet Discharge to: home Follow-up Information    Follow up in 6 weeks.       F/U BP ck with Smart Start on Wednesday, September 09, 2011.  Contraception:  Micronor.  Rx given and instructed to begin on Sunday, September 13, 2011.   Newborn Data: Live born female  Birth Weight: 7 lb 10.2 oz (3465 g) APGAR: 9, 9  Infant remains in NICU at time of discharge due to r/o sepsis.  Athalene Kolle O. 09/05/2011, 10:49 AM

## 2011-09-05 NOTE — Discharge Instructions (Signed)
Iron-Rich Diet An iron-rich diet contains foods that are good sources of iron. Iron is an important mineral that helps your body produce hemoglobin. Hemoglobin is a protein in red blood cells that carries oxygen to the body's tissues. Sometimes, the iron level in your blood can be low. This may be caused by:  A lack of iron in your diet.   Blood loss.   Times of growth, such as during pregnancy or during a child's growth and development.  Low levels of iron can cause a decrease in the number of red blood cells. This can result in iron deficiency anemia. Iron deficiency anemia symptoms include:  Tiredness.   Weakness.   Irritability.   Increased chance of infection.  Here are some recommendations for daily iron intake:  Males older than 35 years of age need 8 mg of iron per day.   Women ages 17 to 81 need 18 mg of iron per day.   Pregnant women need 27 mg of iron per day, and women who are over 75 years of age and breastfeeding need 9 mg of iron per day.   Women over the age of 74 need 8 mg of iron per day.  SOURCES OF IRON There are 2 types of iron that are found in food: heme iron and nonheme iron. Heme iron is absorbed by the body better than nonheme iron. Heme iron is found in meat, poultry, and fish. Nonheme iron is found in grains, beans, and vegetables. Heme Iron Sources Food / Iron (mg)  Chicken liver, 3 oz (85 g)/ 10 mg   Beef liver, 3 oz (85 g)/ 5.5 mg   Oysters, 3 oz (85 g)/ 8 mg   Beef, 3 oz (85 g)/ 2 to 3 mg   Shrimp, 3 oz (85 g)/ 2.8 mg   [place], 3 oz (85 g)/ 2 mg   Chicken, 3 oz (85 g) / 1 mg   Fish (tuna, halibut), 3 oz (85 g)/ 1 mg   Pork, 3 oz (85 g)/ 0.9 mg  Nonheme Iron Sources Food / Iron (mg)  Ready-to-eat breakfast cereal, iron-fortified / 3.9 to 7 mg   Tofu,  cup / 3.4 mg   Kidney beans,  cup / 2.6 mg   Baked potato with skin / 2.7 mg   Asparagus,  cup / 2.2 mg   Avocado / 2 mg   Dried peaches,  cup / 1.6 mg   Raisins,  cup  / 1.5 mg   Soy milk, 1 cup / 1.5 mg   Whole-wheat bread, 1 slice / 1.2 mg   Spinach, 1 cup / 0.8 mg   Broccoli,  cup / 0.6 mg  IRON ABSORPTION Certain foods can decrease the body's absorption of iron. Try to avoid these foods and beverages while eating meals with iron-containing foods:  Coffee.   Tea.   Fiber.   Soy.  Foods containing vitamin C can help increase the amount of iron your body absorbs from iron sources, especially from nonheme sources. Eat foods with vitamin C along with iron-containing foods to increase your iron absorption. Foods that are high in vitamin C include many fruits and vegetables. Some good sources are:  Fresh orange juice.   Oranges.   Strawberries.   Mangoes.   Grapefruit.   Red bell peppers.   Green bell peppers.   Broccoli.   Potatoes with skin.   Tomato juice.  Document Released: 10/07/2004 Document Revised: 02/12/2011 Document Reviewed: 08/14/2010 [facility] Patient Information 2012 [employer].  Begin taking Micronor 1 tab daily on Sunday, September 13, 2011.

## 2011-09-07 ENCOUNTER — Telehealth: Payer: Self-pay | Admitting: Obstetrics and Gynecology

## 2011-09-07 NOTE — Telephone Encounter (Signed)
VM from [employer].  Pt delivered 09/03/11.  Had borderline elevated BP. Negative labs.  Pt needs Smart Start nurse visit 09/09/11 to check BP.   Rosey Bath at [facility] notified.   Will have nurse make home visit.

## 2011-09-08 ENCOUNTER — Telehealth: Payer: Self-pay | Admitting: Obstetrics and Gynecology

## 2011-09-08 ENCOUNTER — Encounter: Payer: Self-pay | Admitting: Obstetrics and Gynecology

## 2011-09-08 ENCOUNTER — Ambulatory Visit (INDEPENDENT_AMBULATORY_CARE_PROVIDER_SITE_OTHER): Payer: BC Managed Care – PPO | Admitting: Obstetrics and Gynecology

## 2011-09-08 VITALS — BP 120/86 | Temp 98.7°F | Resp 16 | Wt 250.0 lb

## 2011-09-08 DIAGNOSIS — O8620 Urinary tract infection following delivery, unspecified: Secondary | ICD-10-CM | POA: Insufficient documentation

## 2011-09-08 DIAGNOSIS — N390 Urinary tract infection, site not specified: Secondary | ICD-10-CM

## 2011-09-08 DIAGNOSIS — O2390 Unspecified genitourinary tract infection in pregnancy, unspecified trimester: Secondary | ICD-10-CM

## 2011-09-08 LAB — POCT URINALYSIS DIPSTICK
Bilirubin, UA: NEGATIVE
Blood, UA: >3
Glucose, UA: NEGATIVE
Ketones, UA: NEGATIVE
Nitrite, UA: NEGATIVE
Protein, UA: 2
Spec Grav, UA: 1.015
Urobilinogen, UA: NEGATIVE
pH, UA: 6

## 2011-09-08 LAB — US OB LIMITED

## 2011-09-08 MED ORDER — CEPHALEXIN 500 MG PO CAPS: 500.0000 mg | ORAL_CAPSULE | Freq: Two times a day (BID) | ORAL | Status: AC

## 2011-09-08 MED ORDER — FLUCONAZOLE 150 MG PO TABS: 150.0000 mg | ORAL_TABLET | Freq: Once | ORAL | Status: AC

## 2011-09-08 NOTE — Progress Notes (Signed)
C/o possible UTI burning while voiding since Sunday

## 2011-09-08 NOTE — Progress Notes (Signed)
Here for ?UTI symptoms--delivered 09/03/11, SVB, 2nd degree laceration, epidural, catheter during labor. Frequency and dysuria started on Sunday. Baby has been in NICU for sepsis w/u--doing well, patient hopes for d/c next 1-2 days.  Baby does have jaundice. Some pedal edema, but no HA, visual symptoms, or epigastric pain. Edema confined to lower legs and feet, particularly when very active going back and forth to NICU, sitting in rocking chair at baby's bedside. Perineum healing. Uterus NT, involution WNL, approx 14 week size, NT Ext DTR 1+ without clonus, 2+ edema.  UA--positive nitrites, +2 protein, +2 LE. Sent to culture. Reviewed UA done at hospital on 6/29 for protein--protein was negative, but nitrites were positive. No culture done, no treatment initiated.  Plan: Rx Keflex 500 mg po BID x 7 days Rx Diflucan 150 mg po x 1 dose prn yeast sx after ATB use. Push fluids, elevate feet.  If edema doesn't improve in next 3-5 days, or if it increases, patient to call [facility]. Schedule 6 week appointment.

## 2011-09-09 ENCOUNTER — Other Ambulatory Visit: Payer: Self-pay | Admitting: Obstetrics and Gynecology

## 2011-09-09 ENCOUNTER — Telehealth: Payer: Self-pay | Admitting: Obstetrics and Gynecology

## 2011-09-09 ENCOUNTER — Inpatient Hospital Stay (HOSPITAL_COMMUNITY)
Admission: AD | Admit: 2011-09-09 | Discharge: 2011-09-09 | Disposition: A | Discharge status: Home / Self Care | Payer: BC Managed Care – PPO | Source: Ambulatory Visit | Attending: Obstetrics and Gynecology | Admitting: Obstetrics and Gynecology

## 2011-09-09 DIAGNOSIS — O4690 Antepartum hemorrhage, unspecified, unspecified trimester: Secondary | ICD-10-CM

## 2011-09-09 DIAGNOSIS — O99893 Other specified diseases and conditions complicating puerperium: Secondary | ICD-10-CM | POA: Insufficient documentation

## 2011-09-09 DIAGNOSIS — IMO0001 Reserved for inherently not codable concepts without codable children: Secondary | ICD-10-CM

## 2011-09-09 DIAGNOSIS — N390 Urinary tract infection, site not specified: Secondary | ICD-10-CM

## 2011-09-09 DIAGNOSIS — G43909 Migraine, unspecified, not intractable, without status migrainosus: Secondary | ICD-10-CM

## 2011-09-09 DIAGNOSIS — R030 Elevated blood-pressure reading, without diagnosis of hypertension: Secondary | ICD-10-CM | POA: Insufficient documentation

## 2011-09-09 LAB — CBC WITH DIFFERENTIAL/PLATELET
Basophils Absolute: 0 10*3/uL (ref 0.0–0.1)
Basophils Relative: 0 % (ref 0–1)
Eosinophils Absolute: 0.1 10*3/uL (ref 0.0–0.7)
Eosinophils Relative: 2 % (ref 0–5)
HCT: 29.6 % — ABNORMAL LOW (ref 36.0–46.0)
Hemoglobin: 9.9 g/dL — ABNORMAL LOW (ref 12.0–15.0)
Lymphocytes Relative: 14 % (ref 12–46)
Lymphs Abs: 1.2 10*3/uL (ref 0.7–4.0)
MCH: 29.7 pg (ref 26.0–34.0)
MCHC: 33.4 g/dL (ref 30.0–36.0)
MCV: 88.9 fL (ref 78.0–100.0)
Monocytes Absolute: 0.8 10*3/uL (ref 0.1–1.0)
Monocytes Relative: 9 % (ref 3–12)
Neutro Abs: 6.6 10*3/uL (ref 1.7–7.7)
Neutrophils Relative %: 75 % (ref 43–77)
Platelets: 269 10*3/uL (ref 150–400)
RBC: 3.33 MIL/uL — ABNORMAL LOW (ref 3.87–5.11)
RDW: 13.9 % (ref 11.5–15.5)
WBC: 8.8 10*3/uL (ref 4.0–10.5)

## 2011-09-09 LAB — COMPREHENSIVE METABOLIC PANEL
ALT: 35 U/L (ref 0–35)
AST: 19 U/L (ref 0–37)
Albumin: 2.6 g/dL — ABNORMAL LOW (ref 3.5–5.2)
Alkaline Phosphatase: 97 U/L (ref 39–117)
BUN: 10 mg/dL (ref 6–23)
CO2: 24 mEq/L (ref 19–32)
Calcium: 7.9 mg/dL — ABNORMAL LOW (ref 8.4–10.5)
Chloride: 103 mEq/L (ref 96–112)
Creatinine, Ser: 0.63 mg/dL (ref 0.50–1.10)
GFR calc Af Amer: >90 mL/min (ref >90)
GFR calc non Af Amer: >90 mL/min (ref >90)
Glucose, Bld: 94 mg/dL (ref 70–99)
Potassium: 3.8 mEq/L (ref 3.5–5.1)
Sodium: 137 mEq/L (ref 135–145)
Total Bilirubin: 0.2 mg/dL — ABNORMAL LOW (ref 0.3–1.2)
Total Protein: 5.8 g/dL — ABNORMAL LOW (ref 6.0–8.3)

## 2011-09-09 LAB — URIC ACID: Uric Acid, Serum: 5.4 mg/dL (ref 2.4–7.0)

## 2011-09-09 LAB — LACTATE DEHYDROGENASE: LDH: 191 U/L (ref 94–250)

## 2011-09-09 MED ORDER — ACETAMINOPHEN 500 MG PO TABS
500.0000 mg | ORAL_TABLET | Freq: Once | ORAL | Status: AC
  Administered 2011-09-09: 500 mg via ORAL
  Filled 2011-09-09: qty 1

## 2011-09-09 MED ORDER — IBUPROFEN 600 MG PO TABS
600.0000 mg | ORAL_TABLET | Freq: Once | ORAL | Status: AC
  Administered 2011-09-09: 600 mg via ORAL
  Filled 2011-09-09: qty 1

## 2011-09-09 NOTE — MAU Note (Signed)
Patient state she had a home health RN today and her blood pressure was elevated. Patient states she has been having a headache and feeling like she is getting the flu.

## 2011-09-09 NOTE — Telephone Encounter (Signed)
VM received from [place].   Pt's BP (R) 130/96 and 138/90.  Has had headache x 24 hr.  Still has edema of legs.  Consult with MK.  Pt to be seen in MAU.  TC to pt.   Verbalizes comprehension.

## 2011-09-09 NOTE — Progress Notes (Signed)
PP day 5 presents with c/o of ha over R eye for 1 day, sensation that it is to bright now that I am here, feels like I am getting sick, achy all over, low backache, on antibiotics x 2 days for UTI, not much appetite drinking lots of water, hx of migraines uses tylenol and motrin for them but took no meds for this, pumping breast milk, baby in NICU, swelling just to ankles is better today though. BPs at home with smart start RN 130/96 138/90 History     Chief Complaint  Patient presents with  . Hypertension   @SFHPI @  OB History    Grav Para Term Preterm Abortions TAB SAB Ect Mult Living   1 1 1  0 0 0 0 0 0 1      Past Medical History  Diagnosis Date  . HSV-2 infection 2011    "no outbreaks ever"  . Abnormal Pap smear 2003  . Herpes   . H/O candidiasis   . H/O cytomegalovirus infection   . Anemia     As a child   . Hx: UTI (urinary tract infection)      x 1  . Kidney stone   . Stomach ulcer   . Vulvar lesion 2009  . Blood transfusion 10/2003    Needed   3 units     Past Surgical History  Procedure Date  . Tonsillectomy 1986  . Eye surgery 2000    LASIX  . Knee surgery 2008  . Wisdom tooth extraction     36 yrs old     Family History  Problem Relation Age of Onset  . Hypertension Mother   . Mental illness Mother     Anxiety  . Depression Mother   . Kidney disease Sister     Kidney stones   . Kidney disease Paternal Aunt   . COPD Maternal Grandfather     Emphysema    History  Substance Use Topics  . Smoking status: Never Smoker   . Smokeless tobacco: Never Used  . Alcohol Use: No    Allergies:  Allergies  Allergen Reactions  . Asa (Aspirin) Other (See Comments)    Pt has bleeding ulcer  . Nsaids Other (See Comments)    Pt has bleeding ulcer    Prescriptions prior to admission  Medication Sig Dispense Refill  . calcium carbonate (TUMS - DOSED IN MG ELEMENTAL CALCIUM) 500 MG chewable tablet Chew 2 tablets by mouth daily as needed. For heartburn       . cephALEXin (KEFLEX) 500 MG capsule Take 1 capsule (500 mg total) by mouth 2 (two) times daily.  40 capsule  0  . ferrous sulfate 325 (65 FE) MG tablet Take 1 tablet (325 mg total) by mouth daily with breakfast.  30 tablet  1  . fish oil-omega-3 fatty acids 1000 MG capsule Take 2 g by mouth daily.      . fluconazole (DIFLUCAN) 150 MG tablet Take 1 tablet (150 mg total) by mouth once.  1 tablet  1  . norethindrone (ORTHO MICRONOR) 0.35 MG tablet Take 1 tablet (0.35 mg total) by mouth daily.  1 Package  11  . oxyCODONE-acetaminophen (PERCOCET) 5-325 MG per tablet Take 1-2 tablets by mouth every 4 (four) hours as needed (moderate - severe pain).  30 tablet  0  . Prenatal Vit-Fe Fumarate-FA (PRENATAL MULTIVITAMIN) TABS Take 1 tablet by mouth daily.        @ROS @ Physical Exam  Calm, no  distress, HEENT WNL, lungs clear bilaterally, AP RRR, abd soft nt, ff U minus 4 abdomen nontender, +1 pitting to edema to lower extremities Blood pressure 135/85, pulse 100, temperature 100.1 F (37.8 C), temperature source Oral, resp. rate 18, last menstrual period 12/11/2010, SpO2 100.00%, currently breastfeeding.  PP day 5 PIH evaluation neg for labor with mildly elevated bps Headache, hx of migraines Has UTI on Keflex P PIH labs, motin and tylenol now. Lavera Guise, CNM Addendum: Results for orders placed during the hospital encounter of 09/09/11 (from the past 24 hour(s))  COMPREHENSIVE METABOLIC PANEL     Status: Abnormal   Collection Time   09/09/11  7:28 PM      Component Value Range   Sodium 137  135 - 145 mEq/L   Potassium 3.8  3.5 - 5.1 mEq/L   Chloride 103  96 - 112 mEq/L   CO2 24  19 - 32 mEq/L   Glucose, Bld 94  70 - 99 mg/dL   BUN 10  6 - 23 mg/dL   Creatinine, Ser 4.09  0.50 - 1.10 mg/dL   Calcium 7.9 (*) 8.4 - 10.5 mg/dL   Total Protein 5.8 (*) 6.0 - 8.3 g/dL   Albumin 2.6 (*) 3.5 - 5.2 g/dL   AST 19  0 - 37 U/L   ALT 35  0 - 35 U/L   Alkaline Phosphatase 97  39 - 117 U/L    Total Bilirubin 0.2 (*) 0.3 - 1.2 mg/dL   GFR calc non Af Amer >90  >90 mL/min   GFR calc Af Amer >90  >90 mL/min  URIC ACID     Status: Normal   Collection Time   09/09/11  7:28 PM      Component Value Range   Uric Acid, Serum 5.4  2.4 - 7.0 mg/dL  CBC WITH DIFFERENTIAL     Status: Abnormal   Collection Time   09/09/11  7:28 PM      Component Value Range   WBC 8.8  4.0 - 10.5 K/uL   RBC 3.33 (*) 3.87 - 5.11 MIL/uL   Hemoglobin 9.9 (*) 12.0 - 15.0 g/dL   HCT 81.1 (*) 91.4 - 78.2 %   MCV 88.9  78.0 - 100.0 fL   MCH 29.7  26.0 - 34.0 pg   MCHC 33.4  30.0 - 36.0 g/dL   RDW 95.6  21.3 - 08.6 %   Platelets 269  150 - 400 K/uL   Neutrophils Relative 75  43 - 77 %   Neutro Abs 6.6  1.7 - 7.7 K/uL   Lymphocytes Relative 14  12 - 46 %   Lymphs Abs 1.2  0.7 - 4.0 K/uL   Monocytes Relative 9  3 - 12 %   Monocytes Absolute 0.8  0.1 - 1.0 K/uL   Eosinophils Relative 2  0 - 5 %   Eosinophils Absolute 0.1  0.0 - 0.7 K/uL   Basophils Relative 0  0 - 1 %   Basophils Absolute 0.0  0.0 - 0.1 K/uL  LACTATE DEHYDROGENASE     Status: Normal   Collection Time   09/09/11  7:28 PM      Component Value Range   LDH 191  94 - 250 U/L   No UA obtained States relief with medication, plan discharge s/s pih to report. Collaboration with Dr. Pennie Rushing per telephone. Lavera Guise, CNM

## 2011-09-10 ENCOUNTER — Encounter: Payer: Self-pay | Admitting: Obstetrics and Gynecology

## 2011-09-10 DIAGNOSIS — N390 Urinary tract infection, site not specified: Secondary | ICD-10-CM | POA: Insufficient documentation

## 2011-09-10 DIAGNOSIS — B9620 Unspecified Escherichia coli [E. coli] as the cause of diseases classified elsewhere: Secondary | ICD-10-CM | POA: Insufficient documentation

## 2011-09-10 LAB — URINE CULTURE: Colony Count: >=100000

## 2011-09-14 ENCOUNTER — Telehealth: Payer: Self-pay | Admitting: Obstetrics and Gynecology

## 2011-09-14 NOTE — Telephone Encounter (Signed)
TC to pt.  Per VL, scheduled appt 09/15/11  To check BP.  To call with any increase or change in sx.  Pt verbalizes comprehension.

## 2011-09-14 NOTE — Telephone Encounter (Signed)
TC from [place], [employer].  States pt's BP 138/96.  No meds.  No PIH sx.  Pt has had little sleep. Will consult with provider.

## 2011-09-15 ENCOUNTER — Ambulatory Visit (INDEPENDENT_AMBULATORY_CARE_PROVIDER_SITE_OTHER): Payer: BC Managed Care – PPO

## 2011-09-15 VITALS — BP 120/90 | Temp 98.7°F | Wt 238.0 lb

## 2011-09-15 DIAGNOSIS — O165 Unspecified maternal hypertension, complicating the puerperium: Secondary | ICD-10-CM

## 2011-09-15 DIAGNOSIS — O9081 Anemia of the puerperium: Secondary | ICD-10-CM

## 2011-09-15 DIAGNOSIS — D649 Anemia, unspecified: Secondary | ICD-10-CM

## 2011-09-15 NOTE — Progress Notes (Signed)
PP BP check delivered 09/03/11 EPDS 9

## 2011-09-20 DIAGNOSIS — O165 Unspecified maternal hypertension, complicating the puerperium: Secondary | ICD-10-CM | POA: Insufficient documentation

## 2011-09-20 DIAGNOSIS — O9081 Anemia of the puerperium: Secondary | ICD-10-CM | POA: Insufficient documentation

## 2011-09-20 HISTORY — DX: Unspecified maternal hypertension, complicating the puerperium: O16.5

## 2011-09-20 NOTE — Progress Notes (Signed)
CC:  BP check S: Denies PIH s/s. Lactating.  Newborn now home from NICU and w/ pt at appt.  Swelling in legs improving per pt.  Seen in MAU 09/09/11 by     [facility] for headache and had PIH w/u.   Elevated BP during hospitalization for delivery, but no evidence of PreE.  VB light.  No other c/o's.     EPPD screen=9 O: 138/100 & 120/90;  BLE:  Mild gen edema; no clonus; DTR's 1+; Gen:  NAD & A&Ox3.    A:  1.  12 days PP w/ elevated diastolic and borderline systolic BP's      2.  PP anemia      3.  Lactating      4.  Recent UTI P:  Pt will arrange Smart Start BP check this Friday and next week and have RN call office w/ results.      Continue PIH precautions, PNV, Iron, and f/u in 49mo for reg PP visit, or prn      CBC & urine TOC NV

## 2011-09-24 ENCOUNTER — Telehealth: Payer: Self-pay | Admitting: Obstetrics and Gynecology

## 2011-09-24 NOTE — Telephone Encounter (Signed)
VM FROM [place], [place] [facility] [phone].   PT'S BP TODAY 122/90.   NO MEDS. NO SX.  TO INFORM PROVIDER.

## 2011-09-24 NOTE — Telephone Encounter (Signed)
After consult with SL, TC to pt.   States no PIH sx.   Per SL, to have appt in office 09/25/11 or 09/28/11.   Pt states has family visiting 09/25/11 and will not be able to come to office.  Sched 09/28/11.  To call with any PIH sx.  Advised to rest and allow family to assist.  Pt verbalizes comprehension.

## 2011-09-28 ENCOUNTER — Ambulatory Visit (INDEPENDENT_AMBULATORY_CARE_PROVIDER_SITE_OTHER): Payer: BC Managed Care – PPO | Admitting: Obstetrics and Gynecology

## 2011-09-28 ENCOUNTER — Encounter: Payer: Self-pay | Admitting: Obstetrics and Gynecology

## 2011-09-28 VITALS — BP 130/76 | Ht 66.5 in | Wt 232.0 lb

## 2011-09-28 DIAGNOSIS — Z0130 Encounter for examination of blood pressure without abnormal findings: Secondary | ICD-10-CM

## 2011-09-28 DIAGNOSIS — Z136 Encounter for screening for cardiovascular disorders: Secondary | ICD-10-CM

## 2011-09-28 NOTE — Progress Notes (Signed)
Patient is 1 week post partum and return for B/p check today. B/p 130/75 No medication offered today. Having sleepless nights with cluster feeding. C/o pickling from sutures on perineum -  Examination of same. Monocryl: dried and uncomfortable for the patient to sit and walk. Perineum well healed. Removed 4 sutures. Patient reports immediate improvement and no discomfort. F/u PP visit 10/20/11 as scheduled

## 2011-10-02 ENCOUNTER — Other Ambulatory Visit (INDEPENDENT_AMBULATORY_CARE_PROVIDER_SITE_OTHER): Payer: BC Managed Care – PPO | Admitting: Obstetrics and Gynecology

## 2011-10-02 ENCOUNTER — Telehealth: Payer: Self-pay | Admitting: Obstetrics and Gynecology

## 2011-10-02 DIAGNOSIS — N390 Urinary tract infection, site not specified: Secondary | ICD-10-CM

## 2011-10-02 LAB — POCT URINALYSIS DIPSTICK
Bilirubin, UA: NEGATIVE
Glucose, UA: NEGATIVE
Nitrite, UA: NEGATIVE
Protein, UA: NEGATIVE
Spec Grav, UA: 1.01
Urobilinogen, UA: NEGATIVE
pH, UA: 5

## 2011-10-02 MED ORDER — NITROFURANTOIN MONOHYD MACRO 100 MG PO CAPS: 100.0000 mg | ORAL_CAPSULE | Freq: Two times a day (BID) | ORAL | Status: AC

## 2011-10-02 MED ORDER — OXYBUTYNIN CHLORIDE ER 10 MG PO TB24: 10.0000 mg | ORAL_TABLET | Freq: Every day | ORAL | Status: DC

## 2011-10-02 NOTE — Progress Notes (Signed)
Lab visit only UA culture per NS. Pt needs an antibiotic.

## 2011-10-02 NOTE — Progress Notes (Signed)
Patient ID: Krista George, female   DOB: November 02, 1975, 36 y.o.   MRN: [id number]  S: Presented with continuing symptoms of UTI and urinary frequency. O: Although on Keflex for past 20 days no change in symptoms.      Describes the same symptoms of UTI as before.      Also describing bladder spasms that are causing her great discomfort.     Urine for Culture today. A: Unresolved UTI P: Tx Macrobid 100mg  po x 10 days      Tx Ditropan10mg s po daily     F/u scheduled for 10/12/11  Earl Gala, CNM.

## 2011-10-02 NOTE — Telephone Encounter (Signed)
TC to pt.   States complete 20 days of Kelfex 09/29/11/  Since last PM is having feeling of incomplete emptying of bladder and muscle spasms.   Per DD pt to office for urine culture.   Will give Rx while pt is here.

## 2011-10-02 NOTE — Telephone Encounter (Signed)
Triage/epic 

## 2011-10-04 LAB — URINE CULTURE: Colony Count: 10000

## 2011-10-12 ENCOUNTER — Encounter: Payer: Self-pay | Admitting: Obstetrics and Gynecology

## 2011-10-12 ENCOUNTER — Ambulatory Visit (INDEPENDENT_AMBULATORY_CARE_PROVIDER_SITE_OTHER): Payer: BC Managed Care – PPO | Admitting: Obstetrics and Gynecology

## 2011-10-12 NOTE — Patient Instructions (Signed)

## 2011-10-12 NOTE — Progress Notes (Signed)
Date of delivery: 09/03/11 Female Name: Krista George Vaginal delivery:yes Cesarean section:no Tubal ligation:no GDM:no Breast Feeding:yes Bottle Feeding:yes Post-Partum Blues:yes Abnormal pap:no Normal GU function: yes Normal GI function:yes Returning to work:yes, needs note EPDS: 13 @name  @age . female who presents for a postpartum visit.  ABD: soft nontender GU: vulva normal no masses seen.  Vagina normal in appearance.  Cervix is parous and NT.  Uterus normal size.  No adnexal tenderness bilaterally or fullness EXT: no CCEB oral progesterone-only contraceptive for [facility] May resume intercourse , exercise and normal activity RT 3 months for AEX

## 2011-10-20 ENCOUNTER — Encounter: Payer: BC Managed Care – PPO | Admitting: Obstetrics and Gynecology

## 2012-01-14 ENCOUNTER — Ambulatory Visit: Payer: BC Managed Care – PPO | Admitting: Obstetrics and Gynecology

## 2012-01-19 ENCOUNTER — Ambulatory Visit: Payer: BC Managed Care – PPO | Admitting: Obstetrics and Gynecology

## 2012-01-28 ENCOUNTER — Encounter: Payer: Self-pay | Admitting: Obstetrics and Gynecology

## 2012-01-28 ENCOUNTER — Ambulatory Visit (INDEPENDENT_AMBULATORY_CARE_PROVIDER_SITE_OTHER): Payer: BC Managed Care – PPO | Admitting: Obstetrics and Gynecology

## 2012-01-28 VITALS — BP 110/80 | HR 64 | Ht 66.5 in | Wt 228.0 lb

## 2012-01-28 DIAGNOSIS — Z124 Encounter for screening for malignant neoplasm of cervix: Secondary | ICD-10-CM

## 2012-01-28 DIAGNOSIS — Z01419 Encounter for gynecological examination (general) (routine) without abnormal findings: Secondary | ICD-10-CM

## 2012-01-28 NOTE — Progress Notes (Signed)
Last Pap: 09/01/10 WNL: Yes Regular Periods:no, breastfeeding Contraception: none   Monthly Breast exam:yes Tetanus<15yrs:yes Nl.Bladder Function:yes Daily BMs:yes Healthy Diet:yes Calcium:no Mammogram:no Date of Mammogram: n/a Exercise:yes Have often Exercise: twice weekly  Seatbelt: yes Abuse at home: no Stressful work:yes Sigmoid-colonoscopy: n/a Bone Density: No PCP: Dr. Laurine Blazer at [facility] Change in PMH: none  Change in [facility]: none BP 110/80  Pulse 64  Ht 5' 6.5" (1.689 m)  Wt 228 lb (103.42 kg)  BMI 36.25 kg/m2  LMP 12/11/2011  Breastfeeding? Yes Pt with complaints:no Physical Examination: General appearance - alert, well appearing, and in no distress Mental status - normal mood, behavior, speech, dress, motor activity, and thought processes Neck - supple, no significant adenopathy,  thyroid exam: thyroid is normal in size without nodules or tenderness Chest - clear to auscultation, no wheezes, rales or rhonchi, symmetric air entry Heart - normal rate and regular rhythm Abdomen - soft, nontender, nondistended, no masses or organomegaly Breasts - breasts appear normal, no suspicious masses, no skin or nipple changes or axillary nodes Pelvic - normal external genitalia, vulva, vagina, cervix, uterus and adnexa Rectal - rectal exam not indicated Back exam - full range of motion, no tenderness, palpable spasm or pain on motion Neurological - alert, oriented, normal speech, no focal findings or movement disorder noted Musculoskeletal - no joint tenderness, deformity or swelling Extremities - no edema, redness or tenderness in the calves or thighs Skin - normal coloration and turgor, no rashes, no suspicious skin lesions noted Routine exam Pap sent yes Mammogram due no condoms used for contraception.  She plans to restart micronor when she stops BF RT 1 yr

## 2012-01-29 LAB — PAP IG W/ RFLX HPV ASCU

## 2012-03-24 ENCOUNTER — Telehealth: Payer: Self-pay

## 2012-03-24 ENCOUNTER — Other Ambulatory Visit: Payer: Self-pay

## 2012-03-24 MED ORDER — NORETHINDRONE 0.35 MG PO TABS: 1.0000 | ORAL_TABLET | Freq: Every day | ORAL | Status: DC

## 2012-03-24 NOTE — Telephone Encounter (Signed)
Spoke with pt informed rx sent to pharm pt voice understanding

## 2012-05-02 ENCOUNTER — Telehealth: Payer: Self-pay | Admitting: Obstetrics and Gynecology

## 2012-05-02 MED ORDER — DESOGESTREL-ETHINYL ESTRADIOL 0.15-0.02/0.01 MG (21/5) PO TABS: 1.0000 | ORAL_TABLET | Freq: Every day | ORAL | Status: DC

## 2012-05-02 NOTE — Telephone Encounter (Signed)
VM from pt. 04/29/12 1:41 Rx for OCP incorrect. Wants [facility]. ND pt. Pt [id number]

## 2012-05-16 ENCOUNTER — Telehealth: Payer: Self-pay | Admitting: Obstetrics and Gynecology

## 2012-05-17 ENCOUNTER — Ambulatory Visit: Payer: BC Managed Care – PPO | Admitting: Obstetrics and Gynecology

## 2012-05-17 ENCOUNTER — Encounter: Payer: Self-pay | Admitting: Obstetrics and Gynecology

## 2012-05-17 VITALS — BP 128/88 | Wt 219.0 lb

## 2012-05-17 DIAGNOSIS — O99345 Other mental disorders complicating the puerperium: Secondary | ICD-10-CM

## 2012-05-17 DIAGNOSIS — N762 Acute vulvitis: Secondary | ICD-10-CM

## 2012-05-17 LAB — POCT WET PREP (WET MOUNT)
Clue Cells Wet Prep Whiff POC: NEGATIVE
PH, VAGINAL: 4.5

## 2012-05-17 MED ORDER — CLOBETASOL PROPIONATE 0.05 % EX CREA: TOPICAL_CREAM | Freq: Two times a day (BID) | CUTANEOUS | Status: DC

## 2012-05-17 MED ORDER — FLUOXETINE HCL 20 MG PO CAPS: 20.0000 mg | ORAL_CAPSULE | Freq: Every day | ORAL | Status: DC

## 2012-05-17 NOTE — Progress Notes (Signed)
Pt c/o persistent vaginal itching.  It started during pregancy and got worse.  It worsens around her menses.  She took a cream without relief.  She does have HSv and is on valtrex daily.  She has no vaginal discharge and the itching is in a U shape on the outside around the clitoris.  She also feels sad and a compulsion to clean everything.  She has a h/o OCD that was txd successfully with prozac in the past.  No HI or SI.  Positive crying spells and anhedonia.  Pt had thyroid studies done with her PCP that was normal   BP 128/88  Wt 219 lb (99.338 kg)  BMI 34.82 kg/m2  LMP 05/10/2012 Physical Examination: General appearance - alert, well appearing, and in no distress Mental status - alert, oriented to person, place, and time, normal mood, behavior, speech, dress, motor activity, and thought processes Neck - supple, no significant adenopathy, thyroid exam: thyroid is normal in size without nodules or tenderness Chest - clear to auscultation, no wheezes, rales or rhonchi, symmetric air entry Heart - normal rate and regular rhythm Abdomen - soft, nontender, nondistended, no masses or organomegaly Pelvic - normal external genitalia, vulva, vagina, cervix, uterus and adnexa, WET MOUNT done - results: negative for pathogens, normal epithelial cells Vulvitis take valtrex BID for three days with symptoms temovate cream for 6 weeks.  IF no relief will consider bx PP depression Pt no longer BF Start prozac rx given Referred for counseling RT 2 weeks

## 2012-05-23 ENCOUNTER — Telehealth: Payer: Self-pay | Admitting: Obstetrics and Gynecology

## 2012-05-23 MED ORDER — CLOBETASOL PROPIONATE 0.05 % EX CREA: TOPICAL_CREAM | Freq: Two times a day (BID) | CUTANEOUS | Status: DC

## 2012-05-23 NOTE — Telephone Encounter (Signed)
Spoke with pt informed rx sent to pharm pt voice understanding 

## 2013-06-23 ENCOUNTER — Other Ambulatory Visit (HOSPITAL_COMMUNITY): Payer: BC Managed Care – PPO | Attending: Psychiatry | Admitting: Psychiatry

## 2013-06-23 ENCOUNTER — Encounter (HOSPITAL_COMMUNITY): Payer: Self-pay

## 2013-06-23 DIAGNOSIS — G4700 Insomnia, unspecified: Secondary | ICD-10-CM | POA: Insufficient documentation

## 2013-06-23 DIAGNOSIS — F410 Panic disorder [episodic paroxysmal anxiety] without agoraphobia: Secondary | ICD-10-CM | POA: Insufficient documentation

## 2013-06-23 DIAGNOSIS — F411 Generalized anxiety disorder: Secondary | ICD-10-CM | POA: Insufficient documentation

## 2013-06-23 DIAGNOSIS — F429 Obsessive-compulsive disorder, unspecified: Secondary | ICD-10-CM

## 2013-06-23 DIAGNOSIS — F332 Major depressive disorder, recurrent severe without psychotic features: Secondary | ICD-10-CM | POA: Insufficient documentation

## 2013-06-23 DIAGNOSIS — F331 Major depressive disorder, recurrent, moderate: Secondary | ICD-10-CM

## 2013-06-23 MED ORDER — ZOLPIDEM TARTRATE ER 12.5 MG PO TBCR
12.5000 mg | EXTENDED_RELEASE_TABLET | Freq: Every day | ORAL | Status: DC
Start: 2013-06-23 — End: 2016-05-12

## 2013-06-23 MED ORDER — ARIPIPRAZOLE 10 MG PO TABS: 10.0000 mg | ORAL_TABLET | Freq: Every day | ORAL | Status: DC

## 2013-06-23 NOTE — Progress Notes (Signed)
Krista George is a 38 y.o., married, Caucasian, female, who was referred per Dr. Doroteo Glassman, treatment for worsening depressive and anxiety symptoms.  Admits to passive SI, no plan or intent.  States that her deterrent is her daughter.  Pt denies HI or A/V hallucinations.  C/O poor sleep (2 hrs), increased appetite (has gained 20 lbs within one year), low energy and motivation, irritable, tearfulness, anhedonia, and poor concentration.  Pt also states that she suffers with panic attacks recently.  Has had five panic attacks since 06-16-13.  Pt also has OCD.  C/O a lot of intrusive thoughts.  Pt voiced various compulsions and rituals.  According to pt, the above symptoms worsened a month ago, but states she has had the symptoms for three months.  "My anxiety started whenever my daughter was 1 old.  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  Reports going on vacation in March and upon her return her employees had written a four page grievance against her.  Pt states she loves what she does at her job, but doesn't like the politics that come with it.  2)  Unresolved grief/loss issues:  In January 2015, pt's 34 yr old Grandmother died in her sleep.  She lived in [place] and pt hadn't seen her in ten years.  "I feel guilty because I had told her that I would visit, but never did."  Pt has limited support here in town.  Her family are all in [place].   3)  Parenting:  Pt has a 22 yr old daughter.  Husband is absent a lot due to working two jobs.  4)  Medical:  Pt recently found out that she has been mis-diagnosis with having Herpes for the past six yrs.   Family Hx:  Mother:  Bipolar, Panic D/O, Agoraphobia, PTSD.  Two Maternal Uncles:  Addicts.  One recently died of Heroin Overdose.  Maternal GM:  Axis II and Depression.  Maternal GF:  Schizophrenia.  Father:  OCD.  Paternal GM:  OCD.  Paternal Side:  ETOH. Pt denies any  psychiatric hospitalizations.  Reports seeing someone for mental illness since age 81.  Denies any prior suicide attempts.  Admits to hx of self mutilation (cutting arms and hands) at age 60.  Has been seeing Dr. Redmond Pulling since October 2014 and Gala Murdoch, NP since August 2014.  PCP:  Dr. Jonathon Jordan.  Denies allergies. Childhood:  Born and raised in [place].  Reports that childhood was "great," although whenever pt was age 20 her mother became mentally ill.  At age [age] pt started having facial TIC's and suffered with OCD symptoms.  According to pt, she excelled in school.  Was captain of her track team and # 6 in her high school class.  "I graduated in the top 3% at my college."   Sibling:  Pt has a twin sister (has anger issues).   Pt has been married to her supportive husband for three years.  The couple has a two year old daughter.   Drugs/ETOH:  Admits to smoking THC everyday while in college.  States she recreationally used cocaine in college.  Reports she was drinking wine everyday two months ago.  She was drinking one glass of wine daily.  Denies any current drugs/ETOH use. Denies cigarettes or gambling.  Denies any legal issues.  Pt will attend [facility] for ten days.  A:  Oriented  pt.  Provided pt with an orientation folder.  Informed Dr. Redmond Pulling and Gala Murdoch, NP of admit.  Encouraged support groups.  R:  Pt receptive.

## 2013-06-26 ENCOUNTER — Other Ambulatory Visit (HOSPITAL_COMMUNITY): Payer: BC Managed Care – PPO | Admitting: Psychiatry

## 2013-06-26 ENCOUNTER — Encounter (HOSPITAL_COMMUNITY): Payer: Self-pay | Admitting: Psychiatry

## 2013-06-26 DIAGNOSIS — F331 Major depressive disorder, recurrent, moderate: Secondary | ICD-10-CM

## 2013-06-26 DIAGNOSIS — F429 Obsessive-compulsive disorder, unspecified: Secondary | ICD-10-CM

## 2013-06-26 NOTE — Progress Notes (Signed)
    Daily Group Progress Note  Program: IOP  Group Time: 9:00-10:30  Participation Level: Active  Behavioral Response: Appropriate  Type of Therapy:  Group Therapy  Summary of Progress: Pt. Met with psychiatrist and case manager.     Group Time: 10:30-12:00  Participation Level:  Active  Behavioral Response: Appropriate  Type of Therapy: Psycho-education Group  Summary of Progress: Pt. Met with psychiatrist and case manager.  Brown, Jennifer B, COUNS 

## 2013-06-26 NOTE — Progress Notes (Signed)
Psychiatric Assessment Adult  Patient Identification:  Krista George Date of Evaluation:  06/26/2013 Chief Complaint: Depression Anxiety panic attacks and OCD History of Chief Complaint:  38 y.o., married, Caucasian, female, who was referred per Dr. Doroteo Glassman, treatment for worsening depressive and anxiety symptoms. Admits to passive SI, no plan or intent. States that her deterrent is her daughter. Pt denies HI or A/V hallucinations. C/O poor sleep (2 hrs), increased appetite (has gained 20 lbs within one year), low energy and motivation, irritable, tearfulness, anhedonia, and poor concentration. Pt also states that she suffers with panic attacks recently. Has had five panic attacks since 06-16-13. Pt also has OCD. C/O a lot of intrusive thoughts. Pt voiced various compulsions and rituals. According to pt, the above symptoms worsened a month ago, but states she has had the symptoms for three months. "My anxiety started whenever my daughter was 51 old. 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 Reports going on vacation in March and upon her return her employees had written a four page grievance against her. Pt states she loves what she does at her job, but doesn't like the politics that come with it. 2) Unresolved grief/loss issues: In January 2015, pt's 71 yr old Grandmother died in her sleep. She lived in [place] and pt hadn't seen her in ten years. "I feel guilty because I had told her that I would visit, but never did." Pt has limited support here in town. Her family are all in [place]. 3) Parenting: Pt has a 52 yr old daughter. Husband is absent a lot due to working two jobs. 4) Medical: Pt recently found out that she has been mis-diagnosis with having Herpes for the past six yrs.  Family Hx: Mother: Bipolar, Panic D/O, Agoraphobia, PTSD. Two Maternal Uncles: Addicts. One recently died of Heroin Overdose. Maternal GM:  Axis II and Depression. Maternal GF: Schizophrenia. Father: OCD. Paternal GM: OCD. Paternal Side: ETOH.  Pt denies any psychiatric hospitalizations. Reports seeing someone for mental illness since age 89. Denies any prior suicide attempts. Admits to hx of self mutilation (cutting arms and hands) at age 86. Has been seeing Dr. Redmond Pulling since October 2014 and Gala Murdoch, NP since August 2014. PCP: Dr. Jonathon Jordan. Denies allergies.  Childhood: Born and raised in [place]. Reports that childhood was "great," although whenever pt was age 20 her mother became mentally ill. At age [age] pt started having facial TIC's and suffered with OCD symptoms. According to pt, she excelled in school. Was captain of her track team and # 6 in her high school class. "I graduated in the top 3% at my college."  Sibling: Pt has a twin sister (has anger issues).  Pt has been married to her supportive husband for three years. The couple has a two year old daughter.  Drugs/ETOH: Admits to smoking THC everyday while in college. States she recreationally used cocaine in college. Reports she was drinking wine everyday two months ago. She was drinking one glass of wine daily. Denies any current drugs/ETOH use.  Denies cigarettes or gambling. Denies any legal issues   HPI Review of Systems Physical Exam  Depressive Symptoms: depressed mood, anhedonia, psychomotor retardation, fatigue, feelings of worthlessness/guilt, difficulty concentrating, hopelessness, anxiety, panic attacks, insomnia, loss of energy/fatigue, weight gain, increased appetite,  (Hypo) Manic Symptoms:  None Anxiety Symptoms: Excessive Worry:  Yes Panic Symptoms:  Yes Agoraphobia:  Yes Obsessive Compulsive: Yes  Symptoms:  Checking, Counting, Organizing and fear of germs Specific Phobias:  Yes Social Anxiety:  Yes patient also has to spell the initial of the last word that anyone is saying, and she traces words as people are speaking to  her  Psychotic Symptoms:  Hallucinations: No None Delusions:  No Paranoia:  No   Ideas of Reference:  No  PTSD Symptoms: None Traumatic Brain Injury: No   Past Psychiatric History: Diagnosis: Anxiety and postpartum depression   Hospitalizations:   Outpatient Care: Gala Murdoch nurse practitioner for medicine and at [address] for therapy has seen multiple psychiatrists since the age of 33 because of OCD and anxiety   Substance Abuse Care:   Self-Mutilation:   Suicidal Attempts:   Violent Behaviors:    Past Medical History:   Past Medical History  Diagnosis Date  . HSV-2 infection 2011    "no outbreaks ever"  . Abnormal Pap smear 2003  . Herpes   . H/O candidiasis   . H/O cytomegalovirus infection   . Anemia     As a child   . Hx: UTI (urinary tract infection)      x 1  . Kidney stone   . Stomach ulcer   . Vulvar lesion 2009  . Blood transfusion 10/2003    Needed   3 units   . Postpartum hypertension 09/20/2011  . Postpartum anemia   . Anxiety   . Depression   . Obsessive-compulsive disorder    History of Loss of Consciousness:  No Seizure History:  No Cardiac History:  No Allergies:  No Known Allergies Current Medications:  Current Outpatient Prescriptions  Medication Sig Dispense Refill  . ARIPiprazole (ABILIFY) 10 MG tablet Take 1 tablet (10 mg total) by mouth daily.  30 tablet  0  . desogestrel-ethinyl estradiol (KARIVA) 0.15-0.02/0.01 MG (21/5) tablet Take 1 tablet by mouth daily.  1 Package  6  . FLUoxetine (PROZAC) 20 MG capsule Take 60 mg by mouth daily.      Marland Kitchen LORazepam (ATIVAN) 0.5 MG tablet Take 0.5 mg by mouth every 4 (four) hours as needed for anxiety.      Marland Kitchen zolpidem (AMBIEN CR) 12.5 MG CR tablet Take 1 tablet (12.5 mg total) by mouth at bedtime.  30 tablet  0   No current facility-administered medications for this visit.    Previous Psychotropic Medications:  Medication Dose   Zoloft                        Substance Abuse History in  the last 12 months: Substance Age of 1st Use Last Use Amount Specific Type  Nicotine      Alcohol  teenager   yesterday   one glass of wine every day    Cannabis  teenager   10 years ago   unknown    Opiates      Cocaine  teenager   10 years ago   unknown    Methamphetamines      LSD      Ecstasy      Benzodiazepines      Caffeine      Inhalants      Others:                          Medical Consequences of Substance Abuse: None  Legal Consequences of Substance Abuse: None  Family Consequences of Substance Abuse: None  Blackouts:  No DT's:  No  Withdrawal Symptoms:  No None  Social History: Current Place of Residence: [place] of Birth:  Family Members:  Marital Status:  Married Children: 1  Sons:   Daughters:  Relationships:  Education:  Secretary/administrator     patient did well in school was a straight a Ship broker, in college she smoked marijuana every day and used cocaine on a when necessary basis Educational Problems/Performance:  Religious Beliefs/Practices:  History of Abuse: none Ship broker History:  None. Legal History:  Hobbies/Interests:   Family History:  Twin sister has anger problems Family History  Problem Relation Age of Onset  . Hypertension Mother   . Mental illness Mother     Anxiety  . Depression Mother   . Bipolar disorder Mother   . Anxiety disorder Mother   . Kidney disease Sister     Kidney stones   . Kidney disease Paternal Aunt   . COPD Maternal Grandfather     Emphysema  . OCD Father   . Alcohol abuse Maternal Uncle   . Drug abuse Maternal Uncle   . Schizophrenia Paternal Uncle   . Depression Maternal Grandmother   . OCD Paternal Grandmother     Mental Status Examination/Evaluation: Objective:  Appearance: Casual  Eye Contact::  Fair  Speech:  Clear and Coherent and Normal Rate  Volume:  Normal  Mood:  Depressed and anxious   Affect:  Constricted and Depressed  Thought Process:  Goal Directed, Linear  and Logical  Orientation:  Full (Time, Place, and Person)  Thought Content:  Rumination  Suicidal Thoughts:  No  Homicidal Thoughts:  No  Judgement:  Good  Insight:  Fair  Psychomotor Activity:  Normal  Akathisia:  No  Handed:  Right  AIMS (if indicated):  0  Assets:  Communication Skills Desire for Improvement Physical Health Resilience Social Support Transportation    Laboratory/X-Ray Psychological Evaluation(s)        Assessment: 38 year old white married female mother of one child admitted because of severe depression anxiety panic attacks and OCD. Patient was referred by her therapist for IOP. Patient has suffered from anxiety and OCD most of her life and has a strong family history of mood disorders anxiety and OCD. Patient has been admitted to IOP for treatment and stabilization  AXIS I Generalized Anxiety Disorder, Major Depression, Recurrent severe, Obsessive Compulsive Disorder and Panic Disorder  AXIS II Cluster C Traits  AXIS III Past Medical History  Diagnosis Date  . HSV-2 infection 2011    "no outbreaks ever"  . Abnormal Pap smear 2003  . Herpes   . H/O candidiasis   . H/O cytomegalovirus infection   . Anemia     As a child   . Hx: UTI (urinary tract infection)      x 1  . Kidney stone   . Stomach ulcer   . Vulvar lesion 2009  . Blood transfusion 10/2003    Needed   3 units   . Postpartum hypertension 09/20/2011  . Postpartum anemia   . Anxiety   . Depression   . Obsessive-compulsive disorder      AXIS IV economic problems, occupational problems, other psychosocial or environmental problems, problems related to social environment and problems with primary support group  AXIS V 51-60 moderate symptoms   Treatment Plan/Recommendations:  Plan of Care: Start IOP   Laboratory:  None at this time  Psychotherapy: Group and individual therapy   Medications: Discussed rationale risks benefits options of Ambien for insomnia and patient  gave informed  consent. Patient will start Ambien 12.5 mg at bedtime. We will DC Ativan. Patient will continue Prozac 60 mg every day and her Abilify will be increased to 5 mg at bedtime.   Routine PRN Medications:  Yes  Consultations: None   Safety Concerns:  None   Other:  Estimated length of stay 2 weeks     Leonides Grills, MD 4/20/201512:34 PM

## 2013-06-26 NOTE — Progress Notes (Signed)
    Daily Group Progress Note  Program: IOP  Group Time: 9:00-10:30  Participation Level: Active  Behavioral Response: Appropriate  Type of Therapy:  Group Therapy  Summary of Progress: Pt. Participated in morning meditation. Pt. Shared history of insomnia for the last two years she has been sleeping for approximately 2 hours, history of OCD. Pt. Discussed history of controlling and perfectionistic behavior, fears of not pleasing others.     Group Time: 10:30-12:00  Participation Level:  Active  Behavioral Response: Appropriate  Type of Therapy: Psycho-education Group  Summary of Progress: Pt. Participated in discussion about developing shame resilience and vulnerability.  [facility]

## 2013-06-27 ENCOUNTER — Telehealth (HOSPITAL_COMMUNITY): Payer: Self-pay | Admitting: Psychiatry

## 2013-06-27 ENCOUNTER — Other Ambulatory Visit (HOSPITAL_COMMUNITY): Payer: BC Managed Care – PPO | Admitting: Psychiatry

## 2013-06-28 ENCOUNTER — Encounter (HOSPITAL_COMMUNITY): Payer: Self-pay | Admitting: Psychiatry

## 2013-06-28 ENCOUNTER — Other Ambulatory Visit (HOSPITAL_COMMUNITY): Payer: BC Managed Care – PPO | Admitting: Psychiatry

## 2013-06-28 DIAGNOSIS — F429 Obsessive-compulsive disorder, unspecified: Secondary | ICD-10-CM

## 2013-06-28 DIAGNOSIS — F331 Major depressive disorder, recurrent, moderate: Secondary | ICD-10-CM

## 2013-06-28 NOTE — Progress Notes (Signed)
    Daily Group Progress Note  Program: IOP  Group Time: 9:00-10:30  Participation Level: Active  Behavioral Response: Appropriate  Type of Therapy:  Group Therapy  Summary of Progress: Pt. Participated in meditation. Pt. Shared struggle with OCD, fears related to other people not perceiving her as perfect, perfectionism, and thoughts and feelings of unworthiness.      Group Time: 10:30-12:00  Participation Level:  Active  Behavioral Response: Appropriate  Type of Therapy: Psycho-education Group  Summary of Progress: Pt. Participated in discussion facilitated by Otelia Santee from the mental health association.  Nancie Neas, COUNS

## 2013-06-28 NOTE — Progress Notes (Signed)
Patient ID: Krista George, female   DOB: February 07, 1976, 38 y.o.   MRN: [id number] Patient reviewed and interviewed today, states her sleep has improved she is able to sleep 5 hours now. Wakes up at 3 AM. Denies suicidal or homicidal ideation also notes that her obsessions have been decreasing. Her feelings of hopelessness and helplessness are completely gone. She is tolerating her medications well. Will continue Prozac 60 mg every day and Abilify 10 mg and Ambien 12.5 mg at bedtime. Patient was asked to continue her Ativan on a regular basis 0.5 mg along with her Ambien and she stated understanding. Denies suicidal or homicidal ideation no hallucinations or delusions.

## 2013-06-29 ENCOUNTER — Other Ambulatory Visit (HOSPITAL_COMMUNITY): Payer: BC Managed Care – PPO | Admitting: Psychiatry

## 2013-06-29 DIAGNOSIS — F429 Obsessive-compulsive disorder, unspecified: Secondary | ICD-10-CM

## 2013-06-30 ENCOUNTER — Encounter (HOSPITAL_COMMUNITY): Payer: Self-pay | Admitting: Psychiatry

## 2013-06-30 ENCOUNTER — Other Ambulatory Visit (HOSPITAL_COMMUNITY): Payer: BC Managed Care – PPO | Admitting: Psychiatry

## 2013-06-30 DIAGNOSIS — F429 Obsessive-compulsive disorder, unspecified: Secondary | ICD-10-CM

## 2013-06-30 NOTE — Progress Notes (Signed)
    Daily Group Progress Note  Program: IOP  Group Time: 9:00-10:30  Participation Level: Active  Behavioral Response: Appropriate  Type of Therapy:  Group Therapy  Summary of Progress: Pt. Participated in meditation. Pt. Discussed challenge of making requests that reflect personal needs. Pt. Discussed that she is generally reluctant to ask for what she wants in a restaurant. Processed need for assertiveness in her life. Pt. Was given homework of making a request in a restaurant.     Group Time: 10:30-12:00  Participation Level:  Active  Behavioral Response: Appropriate  Type of Therapy: Psycho-education Group  Summary of Progress: Pt. Participated in discussion about passive, aggressive, and assertive communication styles.  [facility]

## 2013-06-30 NOTE — Progress Notes (Signed)
    Daily Group Progress Note  Program: IOP  Group Time: 9:00-10:30  Participation Level: Active  Behavioral Response: Appropriate  Type of Therapy:  Group Therapy  Summary of Progress: Pt. Participated in morning meditation. Pt. Shared tendency to be overly critical of self at work, home, and in personal appearance; tendency toward perfectionism.     Group Time: 10:30-12:00  Participation Level:  Active  Behavioral Response: Appropriate  Type of Therapy: Psycho-education Group  Summary of Progress: Pt. Participated in discussion about developing the true self.  Nancie Neas, COUNS

## 2013-07-03 ENCOUNTER — Encounter (HOSPITAL_COMMUNITY): Payer: Self-pay | Admitting: Psychiatry

## 2013-07-03 ENCOUNTER — Other Ambulatory Visit (HOSPITAL_COMMUNITY): Payer: BC Managed Care – PPO | Admitting: Psychiatry

## 2013-07-03 DIAGNOSIS — F429 Obsessive-compulsive disorder, unspecified: Secondary | ICD-10-CM

## 2013-07-03 DIAGNOSIS — F331 Major depressive disorder, recurrent, moderate: Secondary | ICD-10-CM

## 2013-07-03 NOTE — Progress Notes (Signed)
    Daily Group Progress Note  Program: IOP  Group Time: 9:00-10:30  Participation Level: Active  Behavioral Response: Appropriate  Type of Therapy:  Group Therapy  Summary of Progress: Pt. Participated in meditation practice. Pt. Reported that she was having difficulty concentrating over the weekend. Pt. Reported success in asking her husband to help her with household tasks. Pt. Reported that she was able ask for what she wanted at a restaurant. Pt. Continuing to work on asserting herself with fear of not pleasing others.     Group Time: 10:30-12:00  Participation Level:  Active  Behavioral Response: Appropriate  Type of Therapy: Psycho-education Group  Summary of Progress: Pt. Participated in grief and loss discussion facilitated by Jeanella Craze.  Nancie Neas, COUNS

## 2013-07-04 ENCOUNTER — Other Ambulatory Visit (HOSPITAL_COMMUNITY): Payer: BC Managed Care – PPO | Admitting: Psychiatry

## 2013-07-05 ENCOUNTER — Other Ambulatory Visit (HOSPITAL_COMMUNITY): Payer: BC Managed Care – PPO | Admitting: Psychiatry

## 2013-07-06 ENCOUNTER — Other Ambulatory Visit (HOSPITAL_COMMUNITY): Payer: BC Managed Care – PPO | Admitting: Psychiatry

## 2013-07-07 ENCOUNTER — Other Ambulatory Visit (HOSPITAL_COMMUNITY): Payer: BC Managed Care – PPO | Attending: Psychiatry

## 2013-07-07 DIAGNOSIS — G4700 Insomnia, unspecified: Secondary | ICD-10-CM | POA: Insufficient documentation

## 2013-07-07 DIAGNOSIS — F332 Major depressive disorder, recurrent severe without psychotic features: Secondary | ICD-10-CM | POA: Insufficient documentation

## 2013-07-07 DIAGNOSIS — F411 Generalized anxiety disorder: Secondary | ICD-10-CM | POA: Insufficient documentation

## 2013-07-07 DIAGNOSIS — F410 Panic disorder [episodic paroxysmal anxiety] without agoraphobia: Secondary | ICD-10-CM | POA: Insufficient documentation

## 2013-07-07 DIAGNOSIS — F429 Obsessive-compulsive disorder, unspecified: Secondary | ICD-10-CM | POA: Insufficient documentation

## 2013-07-10 ENCOUNTER — Other Ambulatory Visit (HOSPITAL_COMMUNITY): Payer: BC Managed Care – PPO

## 2013-07-11 ENCOUNTER — Other Ambulatory Visit (HOSPITAL_COMMUNITY): Payer: BC Managed Care – PPO

## 2013-07-12 ENCOUNTER — Other Ambulatory Visit (HOSPITAL_COMMUNITY): Payer: BC Managed Care – PPO

## 2013-07-13 ENCOUNTER — Other Ambulatory Visit (HOSPITAL_COMMUNITY): Payer: BC Managed Care – PPO

## 2013-07-14 ENCOUNTER — Other Ambulatory Visit (HOSPITAL_COMMUNITY): Payer: BC Managed Care – PPO | Admitting: Psychiatry

## 2013-07-14 ENCOUNTER — Encounter (HOSPITAL_COMMUNITY): Payer: Self-pay | Admitting: Psychiatry

## 2013-07-14 DIAGNOSIS — F331 Major depressive disorder, recurrent, moderate: Secondary | ICD-10-CM

## 2013-07-14 DIAGNOSIS — F429 Obsessive-compulsive disorder, unspecified: Secondary | ICD-10-CM

## 2013-07-14 NOTE — Progress Notes (Signed)
    Daily Group Progress Note  Program: IOP  Group Time: 9:00-10:30  Participation Level: Active  Behavioral Response: Appropriate  Type of Therapy:  Group Therapy  Summary of Progress: Pt.participated in meditation exercise. Pt. Processed recent trip to visit family. Pt. Processed anger, sadness, disappointment with mother's poor ability to engage because of mental health problems.     Group Time: 10:30-12:00  Participation Level:  Active  Behavioral Response: Appropriate  Type of Therapy: Psycho-education Group  Summary of Progress: Pt. Participated in discussion about common emotions and components of mindfulness.  Nancie Neas, COUNS

## 2013-07-14 NOTE — Progress Notes (Signed)
Patient ID: Krista George, female   DOB: 1975-08-10, 38 y.o.   MRN: [id number] D:  Pt arrived back at [facility] today, after being away for a couple of days (planned trip).  C/O weight gain on the Abilify.  States she has gained 8-10 lbs within two week period.  A:  Placed call to Dr. Salem Senate.  Dr. Salem Senate informed writer to tell pt she would see pt on Monday 07-17-13 to discuss wt gain.  R:  Pt receptive.

## 2013-07-17 ENCOUNTER — Encounter (HOSPITAL_COMMUNITY): Payer: Self-pay | Admitting: Psychiatry

## 2013-07-17 ENCOUNTER — Other Ambulatory Visit (HOSPITAL_COMMUNITY): Payer: BC Managed Care – PPO | Admitting: Psychiatry

## 2013-07-17 DIAGNOSIS — F331 Major depressive disorder, recurrent, moderate: Secondary | ICD-10-CM

## 2013-07-17 MED ORDER — METFORMIN HCL ER 500 MG PO TB24: 500.0000 mg | ORAL_TABLET | Freq: Two times a day (BID) | ORAL | Status: DC

## 2013-07-17 MED ORDER — ARIPIPRAZOLE 10 MG PO TABS: 10.0000 mg | ORAL_TABLET | Freq: Every day | ORAL | Status: DC

## 2013-07-17 NOTE — Progress Notes (Signed)
Discharge Note  Patient:  Krista George is an 38 y.o., female DOB:  1975/08/14  Date of Admission:  06/10/13  Date of Discharge:  07/17/48  Reason for Admission:37 y.o., married, Caucasian, female, who was referred per Dr. Doroteo Glassman, treatment for worsening depressive and anxiety symptoms. Admits to passive SI, no plan or intent. States that her deterrent is her daughter. Pt denies HI or A/V hallucinations. C/O poor sleep (2 hrs), increased appetite (has gained 20 lbs within one year), low energy and motivation, irritable, tearfulness, anhedonia, and poor concentration. Pt also states that she suffers with panic attacks recently. Has had five panic attacks since 06-16-13. Pt also has OCD. C/O a lot of intrusive thoughts. Pt voiced various compulsions and rituals. According to pt, the above symptoms worsened a month ago, but states she has had the symptoms for three months. "My anxiety started whenever my daughter was 68 old. 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 Reports going on vacation in March and upon her return her employees had written a four page grievance against her. Pt states she loves what she does at her job, but doesn't like the politics that come with it. 2) Unresolved grief/loss issues: In January 2015, pt's 18 yr old Grandmother died in her sleep. She lived in [place] and pt hadn't seen her in ten years. "I feel guilty because I had told her that I would visit, but never did." Pt has limited support here in town. Her family are all in [place]. 3) Parenting: Pt has a 50 yr old daughter. Husband is absent a lot due to working two jobs. 4) Medical: Pt recently found out that she has been mis-diagnosis with having Herpes for the past six yrs.  Family Hx: Mother: Bipolar, Panic D/O, Agoraphobia, PTSD. Two Maternal Uncles: Addicts. One recently died of Heroin Overdose. Maternal GM: Axis II and Depression.  Maternal GF: Schizophrenia. Father: OCD. Paternal GM: OCD. Paternal Side: ETOH.  Pt denies any psychiatric hospitalizations. Reports seeing someone for mental illness since age 31. Denies any prior suicide attempts. Admits to hx of self mutilation (cutting arms and hands) at age 11. Has been seeing Dr. Redmond Pulling since October 2014 and Gala Murdoch, NP since August 2014. PCP: Dr. Jonathon Jordan. Denies allergies.  Childhood: Born and raised in [place]. Reports that childhood was "great," although whenever pt was age 70 her mother became mentally ill. At age [age] pt started having facial TIC's and suffered with OCD symptoms. According to pt, she excelled in school. Was captain of her track team and # 6 in her high school class. "I graduated in the top 3% at my college."  Sibling: Pt has a twin sister (has anger issues).  Pt has been married to her supportive husband for three years. The couple has a two year old daughter.  Drugs/ETOH: Admits to smoking THC everyday while in college. States she recreationally used cocaine in college. Reports she was drinking wine everyday two months ago. She was drinking one glass of wine daily. Denies any current drugs/ETOH use.  Denies cigarettes or gambling. Denies any legal issues   Hospital Course: Patient started IOP and was continued on her Prozac 60 mg every day and her Ativan was discontinued. Her Abilify was increased to 10 mg every day and she was started on Ambien 12.5 mg at bedtime for insomnia. Patient continued to struggle with her insomnia and was asked to take  Ativan 0.5 mg along with her Ambien which she did and started sleeping better. Patient gradually stabilized and learnt assertiveness skills. She was also able to work on setting boundaries and limitations and did significant grief therapy in regards to her grandmother's death . She had stabilized well but was concerned about 8 pound weight gain that she had put on since starting Abilify. In order to  prevent metabolic syndrome she was started on metformin 500 mg twice a day which she tolerated well. Patient was coping well and tolerating her medications well sleep and appetite were good mood was stable and calm she was not tearful no feelings of hopelessness or helplessness were present and her concentration was good.  Mental Status at Discharge: Alert, oriented x3, affect was full mood was euthymic speech and language was normal, musculoskeletal and was normal. No suicidal or homicidal ideation was present no hallucinations or delusions was present. Recent and remote memory was good, judgment and insight was good, concentration and recall was good.  Lab Results: No results found for this or any previous visit (from the past 48 hour(s)).  Current outpatient prescriptions:ARIPiprazole (ABILIFY) 10 MG tablet, Take 1 tablet (10 mg total) by mouth daily., Disp: 30 tablet, Rfl: 0;  desogestrel-ethinyl estradiol (KARIVA) 0.15-0.02/0.01 MG (21/5) tablet, Take 1 tablet by mouth daily., Disp: 1 Package, Rfl: 6;  FLUoxetine (PROZAC) 20 MG capsule, Take 60 mg by mouth daily., Disp: , Rfl:  LORazepam (ATIVAN) 0.5 MG tablet, Take 0.5 mg by mouth every 4 (four) hours as needed for anxiety., Disp: , Rfl: ;  metFORMIN (GLUCOPHAGE XR) 500 MG 24 hr tablet, Take 1 tablet (500 mg total) by mouth 2 (two) times daily., Disp: 60 tablet, Rfl: 0;  zolpidem (AMBIEN CR) 12.5 MG CR tablet, Take 1 tablet (12.5 mg total) by mouth at bedtime., Disp: 30 tablet, Rfl: 0  Axis Diagnosis:   Axis I: Anxiety Disorder NOS, Major Depression, Recurrent severe, Obsessive Compulsive Disorder and Panic Disorder Axis II: Cluster A Traits Axis III:  Past Medical History  Diagnosis Date  . HSV-2 infection 2011    "no outbreaks ever"  . Abnormal Pap smear 2003  . Herpes   . H/O candidiasis   . H/O cytomegalovirus infection   . Anemia     As a child   . Hx: UTI (urinary tract infection)      x 1  . Kidney stone   . Stomach ulcer   .  Vulvar lesion 2009  . Blood transfusion 10/2003    Needed   3 units   . Postpartum hypertension 09/20/2011  . Postpartum anemia   . Anxiety   . Depression   . Obsessive-compulsive disorder    Axis IV: occupational problems, problems related to social environment and problems with primary support group Axis V: 61-70 mild symptoms   Level of Care:  OP  Discharge destination:  Home  Is patient on multiple antipsychotic therapies at discharge:  No    Has Patient had three or more failed trials of antipsychotic monotherapy by history:  No  Patient phone:  [phone] (home)  Patient address:   [address] [place] [address],   Follow-up recommendations:  Activity:  As tolerated Diet:  Regular  Comments:  Followup for medications with Gala Murdoch NP, and Cecilie Lowers for therapy  The patient received suicide prevention pamphlet:  No   Leonides Grills 07/17/2013, 11:08 AM

## 2013-07-17 NOTE — Progress Notes (Signed)
    Daily Group Progress Note  Program: IOP  Group Time: 9:00-10:30  Participation Level: Active  Behavioral Response: Appropriate  Type of Therapy:  Group Therapy  Summary of Progress: Pt. Participated in meditation exercise. Pt. 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 Pt. Reported that she continues to have concerns about blood pressure and was not feeling well which she believed to be side effect of the medication. Pt. Reported readiness for discharge with some questions about how to recognize triggers for her anxiety and depression.     Group Time: 10:30-12:00  Participation Level:  Active  Behavioral Response: Appropriate  Type of Therapy: Psycho-education Group  Summary of Progress: Pt. Participated in discussion about recognizing triggers for anxiety and depression (ex., hungry, angry, lonely, tired).  [facility]

## 2013-07-17 NOTE — Patient Instructions (Signed)
Patient completed MH-IOP today.  Will follow up with Gala Murdoch, NP and Dr. Doroteo Glassman.  Encouraged support groups.

## 2013-07-17 NOTE — Progress Notes (Signed)
Krista George is a 38 y.o. married, Caucasian, female, who was referred per Dr. Doroteo Glassman, treatment for worsening depressive and anxiety symptoms. Admitted to passive SI, no plan or intent. Stated that her deterrent is her daughter. Pt denied HI or A/V hallucinations. C/O poor sleep (2 hrs), increased appetite (has gained 20 lbs within one year), low energy and motivation, irritable, tearfulness, anhedonia, and poor concentration. Pt also stated that she suffers with panic attacks recently. Has had five panic attacks since 06-16-13. Pt also has OCD. C/O a lot of intrusive thoughts. Pt voiced various compulsions and rituals. According to pt, the above symptoms worsened a month ago, but states she has had the symptoms for three months. "My anxiety started whenever my daughter was 62 old. 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 Reports going on vacation in March and upon her return her employees had written a four page grievance against her. Pt states she loves what she does at her job, but doesn't like the politics that come with it. 2) Unresolved grief/loss issues: In January 2015, pt's 75 yr old Grandmother died in her sleep. She lived in [place] and pt hadn't seen her in ten years. "I feel guilty because I had told her that I would visit, but never did." Pt has limited support here in town. Her family are all in [place]. 3) Parenting: Pt has a 50 yr old daughter. Husband is absent a lot due to working two jobs. 4) Medical: Pt recently found out that she has been mis-diagnosis with having Herpes for the past six yrs.  Family Hx: Mother: Bipolar, Panic D/O, Agoraphobia, PTSD. Two Maternal Uncles: Addicts. One recently died of Heroin Overdose. Maternal GM: Axis II and Depression. Maternal GF: Schizophrenia. Father: OCD. Paternal GM: OCD. Paternal Side: ETOH.  Pt denies any psychiatric hospitalizations. Reports seeing someone  for mental illness since age 50. Denies any prior suicide attempts. Admits to hx of self mutilation (cutting arms and hands) at age 40. Has been seeing Dr. Redmond Pulling since October 2014 and Gala Murdoch, NP since August 2014. PCP: Dr. Jonathon Jordan. Denies allergies. .Pt completed MH-IOP today.  Denies any SI.  Continues to struggle with increased appetite.  Reports that she has gained 8-10 lbs within two weeks.  Overall mood improved.  Applying coping skills which were learned.  A:  D/C today.  Will follow up with Dr. Doroteo Glassman and Gala Murdoch, NP.  Encouraged support groups.  R:  Pt receptive.

## 2013-07-18 ENCOUNTER — Other Ambulatory Visit (HOSPITAL_COMMUNITY): Payer: BC Managed Care – PPO

## 2013-07-19 ENCOUNTER — Other Ambulatory Visit (HOSPITAL_COMMUNITY): Payer: BC Managed Care – PPO

## 2013-07-20 ENCOUNTER — Other Ambulatory Visit (HOSPITAL_COMMUNITY): Payer: BC Managed Care – PPO

## 2013-07-21 ENCOUNTER — Other Ambulatory Visit (HOSPITAL_COMMUNITY): Payer: BC Managed Care – PPO

## 2013-07-24 ENCOUNTER — Other Ambulatory Visit (HOSPITAL_COMMUNITY): Payer: BC Managed Care – PPO

## 2013-07-25 ENCOUNTER — Other Ambulatory Visit (HOSPITAL_COMMUNITY): Payer: BC Managed Care – PPO

## 2013-07-26 ENCOUNTER — Other Ambulatory Visit (HOSPITAL_COMMUNITY): Payer: BC Managed Care – PPO

## 2013-07-27 ENCOUNTER — Other Ambulatory Visit (HOSPITAL_COMMUNITY): Payer: BC Managed Care – PPO

## 2013-07-28 ENCOUNTER — Other Ambulatory Visit (HOSPITAL_COMMUNITY): Payer: BC Managed Care – PPO

## 2013-08-01 ENCOUNTER — Other Ambulatory Visit (HOSPITAL_COMMUNITY): Payer: BC Managed Care – PPO

## 2013-08-02 ENCOUNTER — Other Ambulatory Visit (HOSPITAL_COMMUNITY): Payer: BC Managed Care – PPO

## 2013-08-03 ENCOUNTER — Other Ambulatory Visit (HOSPITAL_COMMUNITY): Payer: BC Managed Care – PPO

## 2013-08-04 ENCOUNTER — Other Ambulatory Visit (HOSPITAL_COMMUNITY): Payer: BC Managed Care – PPO

## 2013-08-07 ENCOUNTER — Other Ambulatory Visit (HOSPITAL_COMMUNITY): Payer: BC Managed Care – PPO

## 2013-08-08 ENCOUNTER — Other Ambulatory Visit (HOSPITAL_COMMUNITY): Payer: BC Managed Care – PPO

## 2013-08-09 ENCOUNTER — Other Ambulatory Visit (HOSPITAL_COMMUNITY): Payer: BC Managed Care – PPO

## 2013-08-10 ENCOUNTER — Other Ambulatory Visit (HOSPITAL_COMMUNITY): Payer: BC Managed Care – PPO

## 2013-08-11 ENCOUNTER — Other Ambulatory Visit (HOSPITAL_COMMUNITY): Payer: BC Managed Care – PPO

## 2013-08-14 ENCOUNTER — Other Ambulatory Visit (HOSPITAL_COMMUNITY): Payer: BC Managed Care – PPO

## 2013-08-15 ENCOUNTER — Other Ambulatory Visit (HOSPITAL_COMMUNITY): Payer: BC Managed Care – PPO

## 2013-08-16 ENCOUNTER — Other Ambulatory Visit (HOSPITAL_COMMUNITY): Payer: BC Managed Care – PPO

## 2013-08-17 ENCOUNTER — Other Ambulatory Visit (HOSPITAL_COMMUNITY): Payer: BC Managed Care – PPO

## 2013-08-18 ENCOUNTER — Other Ambulatory Visit (HOSPITAL_COMMUNITY): Payer: BC Managed Care – PPO

## 2014-01-08 ENCOUNTER — Encounter (HOSPITAL_COMMUNITY): Payer: Self-pay | Admitting: Psychiatry

## 2016-04-10 ENCOUNTER — Encounter: Payer: Self-pay | Admitting: Gastroenterology

## 2016-05-12 ENCOUNTER — Encounter (INDEPENDENT_AMBULATORY_CARE_PROVIDER_SITE_OTHER): Payer: Self-pay

## 2016-05-12 ENCOUNTER — Encounter: Payer: Self-pay | Admitting: Gastroenterology

## 2016-05-12 ENCOUNTER — Ambulatory Visit (INDEPENDENT_AMBULATORY_CARE_PROVIDER_SITE_OTHER): Payer: BLUE CROSS/BLUE SHIELD | Admitting: Gastroenterology

## 2016-05-12 VITALS — BP 110/82 | HR 76 | Ht 66.5 in | Wt 245.0 lb

## 2016-05-12 DIAGNOSIS — K259 Gastric ulcer, unspecified as acute or chronic, without hemorrhage or perforation: Secondary | ICD-10-CM

## 2016-05-12 NOTE — Patient Instructions (Signed)
We will get records sent from your previous gastroenterologist for review [facility] EGD January 2018 with any associated path reports.  This will include any endoscopic (colonoscopy or upper endoscopy) procedures and any associated pathology reports.   You will be set up for an upper endoscopy to check for healing of gastric ulcers (4 weeks from now). Stay on protonix twice daily. Use tylenol routine aches and pains. This is safe for your stomach.

## 2016-05-12 NOTE — Progress Notes (Signed)
HPI: This is a   very pleasant 41 year old woman  who was referred to me by Harlan Stains, MD  to evaluate  recent gastric ulcers .    Chief complaint is gastric ulcers   Has had GI ulcers in the past.  I cannot find the records of any of her visits here back in 2006 are 2007. She is pretty clear however and recalls having gastric ulcers in the past and I performed EGD. She was on proton pump inhibitor for about a year after that and then stopped and had no problems with her stomach until 2 or 3 months ago.  She has severe  Anxiety with tics. OCD.  These seem under very good control at time of this visit.  03/2016 dark stools, melena.  Also vomited blood.  Called 911: [facility] to [facility].  She had EGD, was transfused 1 unit of blood.  Stayed in hosp 2 nights.   Says she had 3 gastric ulcers.  She was taking lot of NSAIDs.  Had back pain. Was taking 1800 ibuprofen daily for a few weeks.    She was told to stop NSAIDs, which she did.  She has had no repeat bleeding. Has been on protonix bid since then.     Review of systems: Pertinent positive and negative review of systems were noted in the above HPI section. Complete review of systems was performed and was otherwise normal.   Past Medical History:  Diagnosis Date  . Abnormal Pap smear 2003  . Anemia    As a child   . Anxiety   . Blood transfusion 10/2003   Needed   3 units   . Depression   . H/O candidiasis   . H/O cytomegalovirus infection   . Herpes   . HSV-2 infection 2011   "no outbreaks ever"  . Hx: UTI (urinary tract infection)     x 1  . Kidney stone   . Obsessive-compulsive disorder   . Postpartum anemia   . Postpartum hypertension 09/20/2011  . Stomach ulcer   . Vulvar lesion 2009    Past Surgical History:  Procedure Laterality Date  . EYE SURGERY  2000   LASIX  . KNEE SURGERY  2008  . TONSILLECTOMY  1986  . WISDOM TOOTH EXTRACTION     41 yrs old     Current Outpatient Prescriptions  Medication Sig Dispense  Refill  . FLUoxetine (PROZAC) 20 MG capsule Take 80 mg by mouth daily.     . pantoprazole (PROTONIX) 40 MG tablet Take 1 tablet by mouth 2 (two) times daily.     No current facility-administered medications for this visit.     Allergies as of 05/12/2016 - Review Complete 05/12/2016  Allergen Reaction Noted  . Nsaids Other (See Comments) 07/14/2011    Family History  Problem Relation Age of Onset  . Hypertension Mother   . Mental illness Mother     Anxiety  . Depression Mother   . Bipolar disorder Mother   . Anxiety disorder Mother   . COPD Maternal Grandfather     Emphysema  . OCD Father   . Alcohol abuse Maternal Uncle   . Drug abuse Maternal Uncle   . Depression Maternal Grandmother   . OCD Paternal Grandmother   . Kidney disease Sister     Kidney stones   . Kidney disease Paternal Aunt   . Schizophrenia Paternal Uncle     Social History   Social History  . Marital status:  Single    Spouse name: N/A  . Number of children: N/A  . Years of education: N/A   Occupational History  . Not on file.   Social History Main Topics  . Smoking status: Never Smoker  . Smokeless tobacco: Never Used  . Alcohol use No  . Drug use: No  . Sexual activity: Yes    Birth control/ protection: None, Pill   Other Topics Concern  . Not on file   Social History Narrative  . No narrative on file     Physical Exam: BP 110/82   Pulse 76   Ht 5' 6.5" (1.689 m)   Wt 245 lb (111.1 kg)   LMP 04/18/2016 (Approximate)   BMI 38.95 kg/m  Constitutional: generally well-appearing Psychiatric: alert and oriented x3 Eyes: extraocular movements intact Mouth: oral pharynx moist, no lesions Neck: supple no lymphadenopathy Cardiovascular: heart regular rate and rhythm Lungs: clear to auscultation bilaterally Abdomen: soft, nontender, nondistended, no obvious ascites, no peritoneal signs, normal bowel sounds Extremities: no lower extremity edema bilaterally Skin: no lesions on visible  extremities   Assessment and plan: 41 y.o. female with   Bleeding from recent gastric ulcers, NSAID related  we will get records from her [facility] hospital stay including the upper endoscopy and any associated pathology reports. She is pretty good historian. She tells me H. pylori was checked for and was negative. She has been off of NSAIDs now since that admission and has been taking proton pump inhibitor twice daily. I recommended we repeat EGD for her in about a month from now which would be 2 months after the bleeding ulcers were first noted. She understands that is a pretty routine protocol for gastric ulcers. For now she'll stay on twice daily proton pump inhibitor. If the ulcers are healing nicely without have her start to back off of them down to her baseline which is proton pump inhibitor only as needed.    Please see the "Patient Instructions" section for addition details about the plan.   Owens Loffler, MD [facility] 05/12/2016, 2:18 PM  Cc: Harlan Stains, MD

## 2016-05-26 ENCOUNTER — Telehealth: Payer: Self-pay | Admitting: Gastroenterology

## 2016-05-26 NOTE — Telephone Encounter (Signed)
EGD 04/08/2016: for hematemesis, melena; findings: normal esophagus, 3 clean based pyloric ulcers (1 large, 2 small).  No biopsies taken.

## 2016-05-26 NOTE — Telephone Encounter (Signed)
[facility] admit reviewed, including her discharge summary  Admit for acute GI bleeding:  Says she underwent EGD, found "clean based ulcers."  No details on location of ulcers. Hb low 6.3.  I cannot tell how many units of blood she received.  I do not see any H. Pylori testing.  EGD report was not included.  Path report was not included.  Interestingly, she was told to stay on naproxen 500mg  bid PRN at discharge (as well as protonix twice daily).  Will continue with plans outlined at recent office appt.

## 2016-06-19 ENCOUNTER — Encounter: Payer: Self-pay | Admitting: Gastroenterology

## 2016-06-30 ENCOUNTER — Encounter: Payer: Self-pay | Admitting: Gastroenterology

## 2016-06-30 ENCOUNTER — Ambulatory Visit (AMBULATORY_SURGERY_CENTER): Payer: BLUE CROSS/BLUE SHIELD | Admitting: Gastroenterology

## 2016-06-30 VITALS — BP 114/84 | HR 60 | Temp 97.8°F | Resp 22 | Ht 66.5 in | Wt 245.0 lb

## 2016-06-30 DIAGNOSIS — K259 Gastric ulcer, unspecified as acute or chronic, without hemorrhage or perforation: Secondary | ICD-10-CM

## 2016-06-30 MED ORDER — SODIUM CHLORIDE 0.9 % IV SOLN: 500.0000 mL | INTRAVENOUS | Status: DC

## 2016-06-30 NOTE — Patient Instructions (Signed)
YOU HAD AN ENDOSCOPIC PROCEDURE TODAY AT THE [facility]:   Refer to the procedure report that was given to you for any specific questions about what was found during the examination.  If the procedure report does not answer your questions, please call your gastroenterologist to clarify.  If you requested that your care partner not be given the details of your procedure findings, then the procedure report has been included in a sealed envelope for you to review at your convenience later.  YOU SHOULD EXPECT: Some feelings of bloating in the abdomen. Passage of more gas than usual.  Walking can help get rid of the air that was put into your GI tract during the procedure and reduce the bloating. If you had a lower endoscopy (such as a colonoscopy or flexible sigmoidoscopy) you may notice spotting of blood in your stool or on the toilet paper. If you underwent a bowel prep for your procedure, you may not have a normal bowel movement for a few days.  Please Note:  You might notice some irritation and congestion in your nose or some drainage.  This is from the oxygen used during your procedure.  There is no need for concern and it should clear up in a day or so.  SYMPTOMS TO REPORT IMMEDIATELY:   Following upper endoscopy (EGD)  Vomiting of blood or coffee ground material  New chest pain or pain under the shoulder blades  Painful or persistently difficult swallowing  New shortness of breath  Fever of 100F or higher  Black, tarry-looking stools  For urgent or emergent issues, a gastroenterologist can be reached at any hour by calling [phone].   DIET:  We do recommend a small meal at first, but then you may proceed to your regular diet.  Drink plenty of fluids but you should avoid alcoholic beverages for 24 hours.  ACTIVITY:  You should plan to take it easy for the rest of today and you should NOT DRIVE or use heavy machinery until tomorrow (because of the sedation medicines used  during the test).    FOLLOW UP: Our staff will call the number listed on your records the next business day following your procedure to check on you and address any questions or concerns that you may have regarding the information given to you following your procedure. If we do not reach you, we will leave a message.  However, if you are feeling well and you are not experiencing any problems, there is no need to return our call.  We will assume that you have returned to your regular daily activities without incident.  If any biopsies were taken you will be contacted by phone or by letter within the next 1-3 weeks.  Please call us at [phone] if you have not heard about the biopsies in 3 weeks.    SIGNATURES/CONFIDENTIALITY: You and/or your care partner have signed paperwork which will be entered into your electronic medical record.  These signatures attest to the fact that that the information above on your After Visit Summary has been reviewed and is understood.  Full responsibility of the confidentiality of this discharge information lies with you and/or your care-partner. 

## 2016-06-30 NOTE — Progress Notes (Signed)
Report to PACU, RN, vss, BBS= Clear.  

## 2016-06-30 NOTE — Op Note (Signed)
[place] Patient Name: Krista George Procedure Date: 06/30/2016 9:30 AM MRN: [id number] Endoscopist: Milus Banister , MD Age: 41 Referring MD:  Date of Birth: 1975/06/02 Gender: Female Account #: [id number] Procedure:                Upper GI endoscopy Indications:              Follow-up of peptic ulcer; EGD 04/08/2016 at                            [facility]: for hematemesis, melena; findings: normal                            esophagus, 3 clean based pyloric ulcers (1 large, 2                            small). No biopsies taken. Was taking extreme                            NSAIDs Medicines:                Monitored Anesthesia Care Procedure:                Pre-Anesthesia Assessment:                           - Prior to the procedure, a History and Physical                            was performed, and patient medications and                            allergies were reviewed. The patient's tolerance of                            previous anesthesia was also reviewed. The risks                            and benefits of the procedure and the sedation                            options and risks were discussed with the patient.                            All questions were answered, and informed consent                            was obtained. Prior Anticoagulants: The patient has                            taken no previous anticoagulant or antiplatelet                            agents. ASA Grade Assessment: II - A patient with  mild systemic disease. After reviewing the risks                            and benefits, the patient was deemed in                            satisfactory condition to undergo the procedure.                           After obtaining informed consent, the endoscope was                            passed under direct vision. Throughout the                            procedure, the patient's blood pressure, pulse, and                            oxygen saturations were monitored continuously. The                            Model GIF-[id number]) scope was introduced                            through the mouth, and advanced to the second part                            of duodenum. The upper GI endoscopy was                            accomplished without difficulty. The patient                            tolerated the procedure well. Scope In: Scope Out: Findings:                 The esophagus was normal.                           The stomach was normal. The previously described                            gastric ulcers have completely healed.                           The examined duodenum was normal. Complications:            No immediate complications. Estimated blood loss:                            None. Estimated Blood Loss:     Estimated blood loss: none. Impression:               - The previously described gastric ulcers have  completely healed. Recommendation:           - Patient has a contact number available for                            emergencies. The signs and symptoms of potential                            delayed complications were discussed with the                            patient. Return to normal activities tomorrow.                            Written discharge instructions were provided to the                            patient.                           - Resume previous diet.                           - Continue present medications. OK to decrease to                            once daily protonix for another 43months then use                            PRN only.. Should definitely take once daily                            protonix with any future NSAID use.                           - No repeat upper endoscopy. Milus Banister, MD 06/30/2016 9:44:49 AM This report has been signed electronically.

## 2016-07-01 ENCOUNTER — Telehealth: Payer: Self-pay | Admitting: *Deleted

## 2016-07-01 NOTE — Telephone Encounter (Signed)
No answer, message left for the patient. 

## 2016-07-01 NOTE — Telephone Encounter (Signed)
  Follow up Call-  Call back number 06/30/2016  Post procedure Call Back phone  # [phone]  Permission to leave phone message Yes  Some recent data might be hidden     Patient questions:  Do you have a fever, pain , or abdominal swelling? No. Pain Score  0 *  Have you tolerated food without any problems? Yes.    Have you been able to return to your normal activities? Yes.    Do you have any questions about your discharge instructions: Diet   No. Medications  No. Follow up visit  No.  Do you have questions or concerns about your Care? No.  Actions: * If pain score is 4 or above: No action needed, pain <4.

## 2016-12-03 DIAGNOSIS — F411 Generalized anxiety disorder: Secondary | ICD-10-CM | POA: Diagnosis not present

## 2017-01-15 DIAGNOSIS — R946 Abnormal results of thyroid function studies: Secondary | ICD-10-CM | POA: Diagnosis not present

## 2017-01-19 DIAGNOSIS — F411 Generalized anxiety disorder: Secondary | ICD-10-CM | POA: Diagnosis not present

## 2017-02-17 DIAGNOSIS — F429 Obsessive-compulsive disorder, unspecified: Secondary | ICD-10-CM | POA: Diagnosis not present

## 2017-02-17 DIAGNOSIS — F332 Major depressive disorder, recurrent severe without psychotic features: Secondary | ICD-10-CM | POA: Diagnosis not present

## 2017-02-17 DIAGNOSIS — F605 Obsessive-compulsive personality disorder: Secondary | ICD-10-CM | POA: Diagnosis not present

## 2017-06-03 DIAGNOSIS — F330 Major depressive disorder, recurrent, mild: Secondary | ICD-10-CM | POA: Diagnosis not present

## 2017-06-03 DIAGNOSIS — F411 Generalized anxiety disorder: Secondary | ICD-10-CM | POA: Diagnosis not present

## 2017-07-22 DIAGNOSIS — Z1231 Encounter for screening mammogram for malignant neoplasm of breast: Secondary | ICD-10-CM | POA: Diagnosis not present

## 2017-07-22 DIAGNOSIS — Z6836 Body mass index (BMI) 36.0-36.9, adult: Secondary | ICD-10-CM | POA: Diagnosis not present

## 2017-07-22 DIAGNOSIS — N898 Other specified noninflammatory disorders of vagina: Secondary | ICD-10-CM | POA: Diagnosis not present

## 2017-07-22 DIAGNOSIS — Z01419 Encounter for gynecological examination (general) (routine) without abnormal findings: Secondary | ICD-10-CM | POA: Diagnosis not present

## 2017-08-17 DIAGNOSIS — R928 Other abnormal and inconclusive findings on diagnostic imaging of breast: Secondary | ICD-10-CM | POA: Diagnosis not present

## 2017-10-05 DIAGNOSIS — F411 Generalized anxiety disorder: Secondary | ICD-10-CM | POA: Diagnosis not present

## 2017-10-05 DIAGNOSIS — F330 Major depressive disorder, recurrent, mild: Secondary | ICD-10-CM | POA: Diagnosis not present

## 2017-10-17 DIAGNOSIS — H6092 Unspecified otitis externa, left ear: Secondary | ICD-10-CM | POA: Diagnosis not present

## 2017-11-05 DIAGNOSIS — E7800 Pure hypercholesterolemia, unspecified: Secondary | ICD-10-CM | POA: Diagnosis not present

## 2017-11-05 DIAGNOSIS — E559 Vitamin D deficiency, unspecified: Secondary | ICD-10-CM | POA: Diagnosis not present

## 2017-11-05 DIAGNOSIS — Z0000 Encounter for general adult medical examination without abnormal findings: Secondary | ICD-10-CM | POA: Diagnosis not present

## 2017-11-05 DIAGNOSIS — Z79899 Other long term (current) drug therapy: Secondary | ICD-10-CM | POA: Diagnosis not present

## 2018-01-03 DIAGNOSIS — F320 Major depressive disorder, single episode, mild: Secondary | ICD-10-CM | POA: Diagnosis not present

## 2018-01-26 DIAGNOSIS — F320 Major depressive disorder, single episode, mild: Secondary | ICD-10-CM | POA: Diagnosis not present

## 2018-02-21 DIAGNOSIS — F320 Major depressive disorder, single episode, mild: Secondary | ICD-10-CM | POA: Diagnosis not present

## 2018-03-17 DIAGNOSIS — F320 Major depressive disorder, single episode, mild: Secondary | ICD-10-CM | POA: Diagnosis not present

## 2018-03-17 DIAGNOSIS — F411 Generalized anxiety disorder: Secondary | ICD-10-CM | POA: Diagnosis not present

## 2018-03-17 DIAGNOSIS — F330 Major depressive disorder, recurrent, mild: Secondary | ICD-10-CM | POA: Diagnosis not present

## 2018-03-28 DIAGNOSIS — Z6841 Body Mass Index (BMI) 40.0 and over, adult: Secondary | ICD-10-CM | POA: Diagnosis not present

## 2018-03-28 DIAGNOSIS — R030 Elevated blood-pressure reading, without diagnosis of hypertension: Secondary | ICD-10-CM | POA: Diagnosis not present

## 2018-04-07 DIAGNOSIS — F320 Major depressive disorder, single episode, mild: Secondary | ICD-10-CM | POA: Diagnosis not present

## 2018-04-25 DIAGNOSIS — F320 Major depressive disorder, single episode, mild: Secondary | ICD-10-CM | POA: Diagnosis not present

## 2018-05-06 DIAGNOSIS — M25561 Pain in right knee: Secondary | ICD-10-CM | POA: Diagnosis not present

## 2018-05-10 DIAGNOSIS — M2241 Chondromalacia patellae, right knee: Secondary | ICD-10-CM | POA: Diagnosis not present

## 2018-05-18 DIAGNOSIS — F320 Major depressive disorder, single episode, mild: Secondary | ICD-10-CM | POA: Diagnosis not present

## 2018-06-22 DIAGNOSIS — F320 Major depressive disorder, single episode, mild: Secondary | ICD-10-CM | POA: Diagnosis not present

## 2018-07-13 DIAGNOSIS — F411 Generalized anxiety disorder: Secondary | ICD-10-CM | POA: Diagnosis not present

## 2018-07-13 DIAGNOSIS — F330 Major depressive disorder, recurrent, mild: Secondary | ICD-10-CM | POA: Diagnosis not present

## 2018-08-04 DIAGNOSIS — F320 Major depressive disorder, single episode, mild: Secondary | ICD-10-CM | POA: Diagnosis not present

## 2018-08-24 DIAGNOSIS — F320 Major depressive disorder, single episode, mild: Secondary | ICD-10-CM | POA: Diagnosis not present

## 2018-09-15 DIAGNOSIS — F411 Generalized anxiety disorder: Secondary | ICD-10-CM | POA: Diagnosis not present

## 2018-09-15 DIAGNOSIS — F330 Major depressive disorder, recurrent, mild: Secondary | ICD-10-CM | POA: Diagnosis not present

## 2018-09-21 DIAGNOSIS — F320 Major depressive disorder, single episode, mild: Secondary | ICD-10-CM | POA: Diagnosis not present

## 2018-10-20 DIAGNOSIS — F320 Major depressive disorder, single episode, mild: Secondary | ICD-10-CM | POA: Diagnosis not present

## 2018-11-22 DIAGNOSIS — F320 Major depressive disorder, single episode, mild: Secondary | ICD-10-CM | POA: Diagnosis not present

## 2018-12-06 DIAGNOSIS — F331 Major depressive disorder, recurrent, moderate: Secondary | ICD-10-CM | POA: Diagnosis not present

## 2018-12-06 DIAGNOSIS — F330 Major depressive disorder, recurrent, mild: Secondary | ICD-10-CM | POA: Diagnosis not present

## 2018-12-22 DIAGNOSIS — F320 Major depressive disorder, single episode, mild: Secondary | ICD-10-CM | POA: Diagnosis not present

## 2019-01-03 DIAGNOSIS — F411 Generalized anxiety disorder: Secondary | ICD-10-CM | POA: Diagnosis not present

## 2019-01-03 DIAGNOSIS — F330 Major depressive disorder, recurrent, mild: Secondary | ICD-10-CM | POA: Diagnosis not present

## 2019-01-05 DIAGNOSIS — F320 Major depressive disorder, single episode, mild: Secondary | ICD-10-CM | POA: Diagnosis not present

## 2019-01-09 ENCOUNTER — Ambulatory Visit: Payer: BLUE CROSS/BLUE SHIELD | Admitting: Gastroenterology

## 2019-01-19 DIAGNOSIS — F320 Major depressive disorder, single episode, mild: Secondary | ICD-10-CM | POA: Diagnosis not present

## 2019-01-26 DIAGNOSIS — F320 Major depressive disorder, single episode, mild: Secondary | ICD-10-CM | POA: Diagnosis not present

## 2019-01-31 DIAGNOSIS — F320 Major depressive disorder, single episode, mild: Secondary | ICD-10-CM | POA: Diagnosis not present

## 2019-02-14 DIAGNOSIS — F411 Generalized anxiety disorder: Secondary | ICD-10-CM | POA: Diagnosis not present

## 2019-02-14 DIAGNOSIS — F330 Major depressive disorder, recurrent, mild: Secondary | ICD-10-CM | POA: Diagnosis not present

## 2019-02-15 ENCOUNTER — Ambulatory Visit: Payer: BLUE CROSS/BLUE SHIELD | Admitting: Gastroenterology

## 2019-02-15 DIAGNOSIS — F330 Major depressive disorder, recurrent, mild: Secondary | ICD-10-CM | POA: Diagnosis not present

## 2019-02-16 DIAGNOSIS — F320 Major depressive disorder, single episode, mild: Secondary | ICD-10-CM | POA: Diagnosis not present

## 2019-03-01 DIAGNOSIS — F320 Major depressive disorder, single episode, mild: Secondary | ICD-10-CM | POA: Diagnosis not present

## 2019-03-21 DIAGNOSIS — F320 Major depressive disorder, single episode, mild: Secondary | ICD-10-CM | POA: Diagnosis not present

## 2019-03-28 DIAGNOSIS — F330 Major depressive disorder, recurrent, mild: Secondary | ICD-10-CM | POA: Diagnosis not present

## 2019-03-28 DIAGNOSIS — F411 Generalized anxiety disorder: Secondary | ICD-10-CM | POA: Diagnosis not present

## 2019-03-29 DIAGNOSIS — Z20822 Contact with and (suspected) exposure to covid-19: Secondary | ICD-10-CM | POA: Diagnosis not present

## 2019-03-30 DIAGNOSIS — I10 Essential (primary) hypertension: Secondary | ICD-10-CM | POA: Diagnosis not present

## 2019-03-30 DIAGNOSIS — Z20828 Contact with and (suspected) exposure to other viral communicable diseases: Secondary | ICD-10-CM | POA: Diagnosis not present

## 2019-04-16 DIAGNOSIS — R5383 Other fatigue: Secondary | ICD-10-CM | POA: Diagnosis not present

## 2019-04-16 DIAGNOSIS — B349 Viral infection, unspecified: Secondary | ICD-10-CM | POA: Diagnosis not present

## 2019-04-19 DIAGNOSIS — F320 Major depressive disorder, single episode, mild: Secondary | ICD-10-CM | POA: Diagnosis not present

## 2019-04-28 DIAGNOSIS — I10 Essential (primary) hypertension: Secondary | ICD-10-CM | POA: Diagnosis not present

## 2019-05-10 ENCOUNTER — Institutional Professional Consult (permissible substitution): Payer: BC Managed Care – PPO | Admitting: Neurology

## 2019-05-11 DIAGNOSIS — F320 Major depressive disorder, single episode, mild: Secondary | ICD-10-CM | POA: Diagnosis not present

## 2019-05-24 DIAGNOSIS — F330 Major depressive disorder, recurrent, mild: Secondary | ICD-10-CM | POA: Diagnosis not present

## 2019-05-24 DIAGNOSIS — F411 Generalized anxiety disorder: Secondary | ICD-10-CM | POA: Diagnosis not present

## 2019-05-31 DIAGNOSIS — F320 Major depressive disorder, single episode, mild: Secondary | ICD-10-CM | POA: Diagnosis not present

## 2019-07-06 DIAGNOSIS — F320 Major depressive disorder, single episode, mild: Secondary | ICD-10-CM | POA: Diagnosis not present

## 2019-07-27 DIAGNOSIS — F320 Major depressive disorder, single episode, mild: Secondary | ICD-10-CM | POA: Diagnosis not present

## 2019-08-08 ENCOUNTER — Telehealth: Payer: Self-pay | Admitting: Neurology

## 2019-08-08 NOTE — Telephone Encounter (Signed)
Pt was scheduled to see Dr. Rexene Alberts on 05/10/19 at 9 am. Pt cancel appt on 05/09/19 at 2:09 pm not giving 24 hour notice. Pt was rescheduled for 08/09/19 at 9 am. On 08/08/19 at 9:35 am pt canceled sleep consult again without giving 24 hour notice. Pt was rescheduled for 08/28/19 at 9 am. Pt is not to be rescheduled if she can not make that appt.

## 2019-08-09 ENCOUNTER — Institutional Professional Consult (permissible substitution): Payer: BC Managed Care – PPO | Admitting: Neurology

## 2019-08-17 DIAGNOSIS — Z3040 Encounter for surveillance of contraceptives, unspecified: Secondary | ICD-10-CM | POA: Diagnosis not present

## 2019-08-17 DIAGNOSIS — Z1231 Encounter for screening mammogram for malignant neoplasm of breast: Secondary | ICD-10-CM | POA: Diagnosis not present

## 2019-08-17 DIAGNOSIS — Z01419 Encounter for gynecological examination (general) (routine) without abnormal findings: Secondary | ICD-10-CM | POA: Diagnosis not present

## 2019-08-17 DIAGNOSIS — Z1239 Encounter for other screening for malignant neoplasm of breast: Secondary | ICD-10-CM | POA: Diagnosis not present

## 2019-08-17 DIAGNOSIS — Z124 Encounter for screening for malignant neoplasm of cervix: Secondary | ICD-10-CM | POA: Diagnosis not present

## 2019-08-22 DIAGNOSIS — F330 Major depressive disorder, recurrent, mild: Secondary | ICD-10-CM | POA: Diagnosis not present

## 2019-08-22 DIAGNOSIS — F411 Generalized anxiety disorder: Secondary | ICD-10-CM | POA: Diagnosis not present

## 2019-08-24 DIAGNOSIS — F320 Major depressive disorder, single episode, mild: Secondary | ICD-10-CM | POA: Diagnosis not present

## 2019-08-28 ENCOUNTER — Institutional Professional Consult (permissible substitution): Payer: BC Managed Care – PPO | Admitting: Neurology

## 2019-08-28 ENCOUNTER — Encounter: Payer: Self-pay | Admitting: Neurology

## 2019-08-28 NOTE — Telephone Encounter (Signed)
Pt no showed to sleep consult today on 08/28/19. Dr. Rexene Alberts do you want to dismiss this pt?

## 2019-08-28 NOTE — Telephone Encounter (Signed)
Please follow dismissal protocol as per our No Show Policy.   

## 2019-09-27 DIAGNOSIS — E7800 Pure hypercholesterolemia, unspecified: Secondary | ICD-10-CM | POA: Diagnosis not present

## 2019-09-27 DIAGNOSIS — E559 Vitamin D deficiency, unspecified: Secondary | ICD-10-CM | POA: Diagnosis not present

## 2019-09-27 DIAGNOSIS — Z0000 Encounter for general adult medical examination without abnormal findings: Secondary | ICD-10-CM | POA: Diagnosis not present

## 2019-09-27 DIAGNOSIS — Z79899 Other long term (current) drug therapy: Secondary | ICD-10-CM | POA: Diagnosis not present

## 2019-09-28 DIAGNOSIS — F320 Major depressive disorder, single episode, mild: Secondary | ICD-10-CM | POA: Diagnosis not present

## 2020-04-10 ENCOUNTER — Encounter: Payer: Self-pay | Admitting: Nurse Practitioner

## 2020-04-10 ENCOUNTER — Ambulatory Visit: Payer: BC Managed Care – PPO | Admitting: Nurse Practitioner

## 2020-04-10 VITALS — BP 144/96 | HR 80 | Ht 66.5 in | Wt 279.1 lb

## 2020-04-10 DIAGNOSIS — Z8711 Personal history of peptic ulcer disease: Secondary | ICD-10-CM

## 2020-04-10 DIAGNOSIS — R112 Nausea with vomiting, unspecified: Secondary | ICD-10-CM

## 2020-04-10 DIAGNOSIS — R194 Change in bowel habit: Secondary | ICD-10-CM | POA: Diagnosis not present

## 2020-04-10 MED ORDER — ONDANSETRON HCL 4 MG PO TABS: 4.0000 mg | ORAL_TABLET | Freq: Three times a day (TID) | ORAL | 2 refills | Status: AC | PRN

## 2020-04-10 MED ORDER — PLENVU 140 G PO SOLR: 1.0000 | ORAL | 0 refills | Status: DC

## 2020-04-10 NOTE — Patient Instructions (Signed)
If you are age 44 or younger, your body mass index should be between 19-25. Your Body mass index is 44.38 kg/m. If this is out of the aformentioned range listed, please consider follow up with your Primary Care Provider.    We have sent the following medications to your pharmacy for you to pick up at your convenience: Plenvu, Zofran   You have been scheduled for an endoscopy and colonoscopy. Please follow the written instructions given to you at your visit today. Please pick up your prep supplies at the pharmacy within the next 1-3 days. If you use inhalers (even only as needed), please bring them with you on the day of your procedure.  Thank you for choosing me and [facility].  Tye Savoy NP

## 2020-04-10 NOTE — Progress Notes (Signed)
ASSESSMENT AND PLAN    # Bowel changes with daily diarrhea x 2 years. Twin sister diagnosed in 2020 with neuroendocrine tumor of colon --Patient will be scheduled for a colonoscopy. The risks and benefits of colonoscopy with possible polypectomy / biopsies were discussed and the patient agrees to proceed.   # Nausea and vomiting x 3 weeks --Trial of Zofran --Pregnancy test in mid Jan was negative. She has seen had a menstrual cycle. Uses birth control.  --Hx of of PUD.  For further evaluation will arrange for EGD to be done at time of colonoscopy. The risks and benefits of EGD were discussed and the patient agrees to proceed.   # GERD with regurgitation. Asymptomic on PPI since 2018  [place]     Primary Gastroenterologist : Oretha Caprice, MD      Chief Complaint : Nausea, vomiting and diarrhea  Krista George is a 45 y.o. female with PMH / Lee significant for,  but not necessarily limited to: PUD and HTN   Paitent is here with complaints of daily diarrhea  for two years. No solid stool in last two years. She has an average of 5 liquid BMs a day. Prior to two years ago she had loose stool about one time a week and Bms were otherwise normal. She cannot correlate bowel changes with any medication changes or dietary changes. She has realized that the change in bowels occurred when she started working remotely from home 2 years ago. No blood in stool. She has gained 40 pounds in two years.   Patient has a history of upper GI bleed  / PUD in 2005 and another at [facility] in 2018.  No evidence for H.pylori. She had been taking NSAIDS both times.   Additionally,  patient developed nausea / vomiting 3 weeks ago. Symptoms most severe in am. She mainly vomits mucus in am and has dry heaves remainder of day. Pregnancy test on 03/26/19 was negative and then started menstrual cycle. She uses Nuvaring for birth control. Patient has not been taking NSAIDS. She has no abdominal  pain. No blood in stool or black stool. Doesn't use THC.   In October 2020 patient's twin sister was diagnosed with neuroendocrine tumor of colon in [place].    Previous Endoscopic Evaluations / Pertinent Studies:  April 2018 EGD to follow-up on [email or web address] at [facility] -The esophagus was normal. - The stomach was normal. The previously described gastric ulcers have completely healed. - The examined duodenum was normal.  Past Medical History:  Diagnosis Date  . Abnormal Pap smear 2003  . Anemia    As a child   . Anxiety   . Blood transfusion 10/2003   Needed   3 units   . Depression   . H/O candidiasis   . H/O cytomegalovirus infection   . Herpes   . HSV-2 infection 2011   "no outbreaks ever"  . Hx: UTI (urinary tract infection)     x 1  . Kidney stone   . Obsessive-compulsive disorder   . Postpartum anemia   . Postpartum hypertension 09/20/2011  . Stomach ulcer   . Vulvar lesion 2009     Past Surgical History:  Procedure Laterality Date  . EYE SURGERY  2000   LASIX  . KNEE SURGERY  2008  . TONSILLECTOMY  1986  . UPPER GASTROINTESTINAL ENDOSCOPY    . WISDOM TOOTH EXTRACTION     45 yrs old  Family History  Problem Relation Age of Onset  . Hypertension Mother   . Mental illness Mother        Anxiety  . Depression Mother   . Bipolar disorder Mother   . Anxiety disorder Mother   . COPD Maternal Grandfather        Emphysema  . OCD Father   . Alcohol abuse Maternal Uncle   . Drug abuse Maternal Uncle   . Depression Maternal Grandmother   . OCD Paternal Grandmother   . Kidney disease Sister        Kidney stones   . Colon cancer Sister   . Kidney disease Paternal Aunt   . Schizophrenia Paternal Uncle   . Esophageal cancer Neg Hx   . Pancreatic cancer Neg Hx   . Stomach cancer Neg Hx    Social History   Tobacco Use  . Smoking status: Never Smoker  . Smokeless tobacco: Never Used  Vaping Use  . Vaping Use: Never used  Substance Use Topics  .  Alcohol use: No  . Drug use: No   Current Outpatient Medications  Medication Sig Dispense Refill  . atenolol (TENORMIN) 25 MG tablet Take 25 mg by mouth daily.    Marland Kitchen FLUoxetine (PROZAC) 40 MG capsule Take 80 mg by mouth daily.     Marland Kitchen omeprazole (PRILOSEC OTC) 20 MG tablet Take 20 mg by mouth daily.     Current Facility-Administered Medications  Medication Dose Route Frequency Provider Last Rate Last Admin  . 0.9 %  sodium chloride infusion  500 mL Intravenous Continuous Milus Banister, MD       Allergies  Allergen Reactions  . Nsaids Other (See Comments)    Pt has bleeding ulcer     Review of Systems: All systems reviewed and negative except where noted in HPI.   PHYSICAL EXAM :    Wt Readings from Last 3 Encounters:  04/10/20 279 lb 2 oz (126.6 kg)  06/30/16 245 lb (111.1 kg)  05/12/16 245 lb (111.1 kg)    BP (!) 144/96 (BP Location: Left Arm, Patient Position: Sitting, Cuff Size: Normal)   Pulse 80   Ht 5' 6.5" (1.689 m)   Wt 279 lb 2 oz (126.6 kg)   BMI 44.38 kg/m  Constitutional:  Pleasant female in no acute distress. Psychiatric: Normal mood and affect. Behavior is normal. EENT: Pupils normal.  Conjunctivae are normal. No scleral icterus. Neck supple.  Cardiovascular: Normal rate, regular rhythm. No edema Pulmonary/chest: Effort normal and breath sounds normal. No wheezing, rales or rhonchi. Abdominal: Soft, nondistended, nontender. Bowel sounds active throughout. There are no masses palpable. No hepatomegaly. Neurological: Alert and oriented to person place and time. Skin: Skin is warm and dry. No rashes noted.  Tye Savoy, NP  04/10/2020, 3:24 PM

## 2020-04-11 ENCOUNTER — Encounter: Payer: Self-pay | Admitting: Nurse Practitioner

## 2020-04-12 NOTE — Progress Notes (Signed)
I agree with the above note, plan 

## 2020-06-05 ENCOUNTER — Other Ambulatory Visit: Payer: Self-pay

## 2020-06-05 ENCOUNTER — Encounter: Payer: Self-pay | Admitting: Gastroenterology

## 2020-06-05 ENCOUNTER — Ambulatory Visit (AMBULATORY_SURGERY_CENTER): Payer: BC Managed Care – PPO | Admitting: Gastroenterology

## 2020-06-05 VITALS — BP 120/86 | HR 74 | Temp 97.5°F | Resp 27

## 2020-06-05 DIAGNOSIS — K2970 Gastritis, unspecified, without bleeding: Secondary | ICD-10-CM | POA: Diagnosis not present

## 2020-06-05 DIAGNOSIS — K529 Noninfective gastroenteritis and colitis, unspecified: Secondary | ICD-10-CM

## 2020-06-05 DIAGNOSIS — C49A5 Gastrointestinal stromal tumor of rectum: Secondary | ICD-10-CM | POA: Diagnosis not present

## 2020-06-05 DIAGNOSIS — D3A8 Other benign neuroendocrine tumors: Secondary | ICD-10-CM

## 2020-06-05 DIAGNOSIS — K633 Ulcer of intestine: Secondary | ICD-10-CM

## 2020-06-05 DIAGNOSIS — K5000 Crohn's disease of small intestine without complications: Secondary | ICD-10-CM | POA: Diagnosis not present

## 2020-06-05 DIAGNOSIS — D128 Benign neoplasm of rectum: Secondary | ICD-10-CM

## 2020-06-05 DIAGNOSIS — R194 Change in bowel habit: Secondary | ICD-10-CM

## 2020-06-05 DIAGNOSIS — R112 Nausea with vomiting, unspecified: Secondary | ICD-10-CM

## 2020-06-05 MED ORDER — SODIUM CHLORIDE 0.9 % IV SOLN: 500.0000 mL | Freq: Once | INTRAVENOUS | Status: DC

## 2020-06-05 NOTE — Patient Instructions (Signed)
Await pathology  Please read over handout about polyps and gastritis  Continue your normal medications  YOU HAD AN ENDOSCOPIC PROCEDURE TODAY AT [facility]:   Refer to the procedure report that was given to you for any specific questions about what was found during the examination.  If the procedure report does not answer your questions, please call your gastroenterologist to clarify.  If you requested that your care partner not be given the details of your procedure findings, then the procedure report has been included in a sealed envelope for you to review at your convenience later.  YOU SHOULD EXPECT: Some feelings of bloating in the abdomen. Passage of more gas than usual.  Walking can help get rid of the air that was put into your GI tract during the procedure and reduce the bloating. If you had a lower endoscopy (such as a colonoscopy or flexible sigmoidoscopy) you may notice spotting of blood in your stool or on the toilet paper. If you underwent a bowel prep for your procedure, you may not have a normal bowel movement for a few days.  Please Note:  You might notice some irritation and congestion in your nose or some drainage.  This is from the oxygen used during your procedure.  There is no need for concern and it should clear up in a day or so.  SYMPTOMS TO REPORT IMMEDIATELY:   Following lower endoscopy (colonoscopy or flexible sigmoidoscopy):  Excessive amounts of blood in the stool  Significant tenderness or worsening of abdominal pains  Swelling of the abdomen that is new, acute  Fever of 100F or higher   Following upper endoscopy (EGD)  Vomiting of blood or coffee ground material  New chest pain or pain under the shoulder blades  Painful or persistently difficult swallowing  New shortness of breath  Fever of 100F or higher  Black, tarry-looking stools  For urgent or emergent issues, a gastroenterologist can be reached at any hour by calling [phone]  [phone]. Do not use MyChart messaging for urgent concerns.    DIET:  We do recommend a small meal at first, but then you may proceed to your regular diet.  Drink plenty of fluids but you should avoid alcoholic beverages for 24 hours.  ACTIVITY:  You should plan to take it easy for the rest of today and you should NOT DRIVE or use heavy machinery until tomorrow (because of the sedation medicines used during the test).    FOLLOW UP: Our staff will call the number listed on your records 48-72 hours following your procedure to check on you and address any questions or concerns that you may have regarding the information given to you following your procedure. If we do not reach you, we will leave a message.  We will attempt to reach you two times.  During this call, we will ask if you have developed any symptoms of COVID 19. If you develop any symptoms (ie: fever, flu-like symptoms, shortness of breath, cough etc.) before then, please call [phone].  If you test positive for Covid 19 in the 2 weeks post procedure, please call and report this information to [place].    If any biopsies were taken you will be contacted by phone or by letter within the next 1-3 weeks.  Please call us at [phone] if you have not heard about the biopsies in 3 weeks.    SIGNATURES/CONFIDENTIALITY: You and/or your care partner have signed paperwork which will be entered into  your electronic medical record.  These signatures attest to the fact that that the information above on your After Visit Summary has been reviewed and is understood.  Full responsibility of the confidentiality of this discharge information lies with you and/or your care-partner.\

## 2020-06-05 NOTE — Progress Notes (Signed)
A.G. vital signs. 

## 2020-06-05 NOTE — Progress Notes (Signed)
Pt's states no medical or surgical changes since previsit or office visit. 

## 2020-06-05 NOTE — Op Note (Signed)
[place] Patient Name: Krista George Procedure Date: 06/05/2020 3:01 PM MRN: [id number] Endoscopist: Milus Banister , MD Age: 45 Referring MD:  Date of Birth: [birth year] Gender: Female Account #: [id number] Procedure:                Colonoscopy Indications:              Chronic diarrhea Medicines:                Monitored Anesthesia Care Procedure:                Pre-Anesthesia Assessment:                           - Prior to the procedure, a History and Physical                            was performed, and patient medications and                            allergies were reviewed. The patient's tolerance of                            previous anesthesia was also reviewed. The risks                            and benefits of the procedure and the sedation                            options and risks were discussed with the patient.                            All questions were answered, and informed consent                            was obtained. Prior Anticoagulants: The patient has                            taken no previous anticoagulant or antiplatelet                            agents. ASA Grade Assessment: II - A patient with                            mild systemic disease. After reviewing the risks                            and benefits, the patient was deemed in                            satisfactory condition to undergo the procedure.                           After obtaining informed consent, the colonoscope  was passed under direct vision. Throughout the                            procedure, the patient's blood pressure, pulse, and                            oxygen saturations were monitored continuously. The                            Olympus CF-[id number]) Colonoscope was                            introduced through the anus and advanced to the the                            terminal ileum. The colonoscopy was  performed                            without difficulty. The patient tolerated the                            procedure well. The quality of the bowel                            preparation was good. The terminal ileum, ileocecal                            valve, appendiceal orifice, and rectum were                            photographed. Scope In: 3:08:42 PM Scope Out: 3:20:49 PM Scope Withdrawal Time: 0 hours 9 minutes 50 seconds  Total Procedure Duration: 0 hours 12 minutes 7 seconds  Findings:                 Inflammation, moderate in severity and                            characterized by erosions, several very small                            ulcers, edema, friability and granularity in the                            terminal ileum. Biopsies were taken with a cold                            forceps for histology. jar 1                           Colonic mucosa was very slightly granular appearing                            throughout; biopsies taken from right colon (jar 2)  and left colon (jar 3).                           A 4 mm polyp was found in the mid rectum. The polyp                            was sessile. The polyp was removed with a cold                            snare. Resection and retrieval were complete.                           The exam was otherwise without abnormality on                            direct and retroflexion views. Complications:            No immediate complications. Estimated blood loss:                            None. Estimated Blood Loss:     Estimated blood loss: none. Impression:               - Inflammation, moderate in severity and                            characterized by erosions, several very small                            ulcers, edema, friability and granularity in the                            terminal ileum. Biopsies were taken with a cold                            forceps for histology.                            - Colonic mucosa was very slightly granular                            appearing throughout; biopsies taken from right                            colon (jar 2) and left colon (jar 3).                           - One 4 mm polyp in the mid rectum, removed with a                            cold snare. Resected and retrieved.                           - The examination was otherwise normal on direct  and retroflexion views. Recommendation:           - EGD now.                           - Await pathology results. Milus Banister, MD 06/05/2020 3:32:29 PM This report has been signed electronically.

## 2020-06-05 NOTE — Progress Notes (Signed)
Called to room to assist during endoscopic procedure.  Patient ID and intended procedure confirmed with present staff. Received instructions for my participation in the procedure from the performing physician.  

## 2020-06-05 NOTE — Progress Notes (Signed)
To PACU, VSS. Report to [email or web address] 

## 2020-06-05 NOTE — Op Note (Signed)
[place] Patient Name: Krista George Procedure Date: 06/05/2020 3:01 PM MRN: [id number] Endoscopist: Milus Banister , MD Age: 45 Referring MD:  Date of Birth: [birth year] Gender: Female Account #: [id number] Procedure:                Upper GI endoscopy Indications:              Nausea with vomiting Medicines:                Monitored Anesthesia Care Procedure:                Pre-Anesthesia Assessment:                           - Prior to the procedure, a History and Physical                            was performed, and patient medications and                            allergies were reviewed. The patient's tolerance of                            previous anesthesia was also reviewed. The risks                            and benefits of the procedure and the sedation                            options and risks were discussed with the patient.                            All questions were answered, and informed consent                            was obtained. Prior Anticoagulants: The patient has                            taken no previous anticoagulant or antiplatelet                            agents. ASA Grade Assessment: III - A patient with                            severe systemic disease. After reviewing the risks                            and benefits, the patient was deemed in                            satisfactory condition to undergo the procedure.                           After obtaining informed consent, the endoscope was  passed under direct vision. Throughout the                            procedure, the patient's blood pressure, pulse, and                            oxygen saturations were monitored continuously. The                            Endoscope was introduced through the mouth, and                            advanced to the second part of duodenum. The upper                            GI endoscopy was accomplished  without difficulty.                            The patient tolerated the procedure well. Scope In: Scope Out: Findings:                 Mild inflammation characterized by erythema and                            friability was found in the gastric antrum.                            Biopsies were taken with a cold forceps for                            histology.                           The exam was otherwise without abnormality. Complications:            No immediate complications. Estimated blood loss:                            None. Estimated Blood Loss:     Estimated blood loss: none. Impression:               - Mild, non-specific gastritis; biopsied.                           - The examination was otherwise normal. Recommendation:           - Patient has a contact number available for                            emergencies. The signs and symptoms of potential                            delayed complications were discussed with the                            patient. Return to normal activities tomorrow.  Written discharge instructions were provided to the                            patient.                           - Resume previous diet.                           - Continue present medications.                           - Await pathology results. Milus Banister, MD 06/05/2020 3:34:17 PM This report has been signed electronically.

## 2020-06-07 ENCOUNTER — Telehealth: Payer: Self-pay

## 2020-06-07 ENCOUNTER — Telehealth: Payer: Self-pay | Admitting: *Deleted

## 2020-06-07 NOTE — Telephone Encounter (Signed)
  Follow up Call-  Call back number 06/05/2020  Post procedure Call Back phone  # [phone]  Permission to leave phone message Yes  Some recent data might be hidden     Patient questions:  Do you have a fever, pain , or abdominal swelling? No. Pain Score  0 *  Have you tolerated food without any problems? Yes.    Have you been able to return to your normal activities? Yes.    Do you have any questions about your discharge instructions: Diet   No. Medications  No. Follow up visit  No.  Do you have questions or concerns about your Care? No.  Actions: * If pain score is 4 or above: No action needed, pain <4.  1. Have you developed a fever since your procedure? no  2.   Have you had an respiratory symptoms (SOB or cough) since your procedure? no  3.   Have you tested positive for COVID 19 since your procedure no  4.   Have you had any family members/close contacts diagnosed with the COVID 19 since your procedure?  No  Pt states she feel "irritated in her nose" and a "little swollen under her neck."  No fever or any other issues.  Told to all if she develops any fever or issues   If yes to any of these questions please route to Joylene John, RN and Joella Prince, RN

## 2020-06-07 NOTE — Telephone Encounter (Signed)
No answer on follow up call. 

## 2020-06-20 ENCOUNTER — Other Ambulatory Visit: Payer: Self-pay

## 2020-06-20 DIAGNOSIS — R194 Change in bowel habit: Secondary | ICD-10-CM

## 2020-06-20 DIAGNOSIS — R112 Nausea with vomiting, unspecified: Secondary | ICD-10-CM

## 2020-06-28 ENCOUNTER — Other Ambulatory Visit: Payer: Self-pay

## 2020-06-28 DIAGNOSIS — R194 Change in bowel habit: Secondary | ICD-10-CM

## 2020-06-28 DIAGNOSIS — R112 Nausea with vomiting, unspecified: Secondary | ICD-10-CM

## 2020-07-01 ENCOUNTER — Other Ambulatory Visit (INDEPENDENT_AMBULATORY_CARE_PROVIDER_SITE_OTHER): Payer: BC Managed Care – PPO

## 2020-07-01 DIAGNOSIS — R112 Nausea with vomiting, unspecified: Secondary | ICD-10-CM | POA: Diagnosis not present

## 2020-07-01 DIAGNOSIS — R194 Change in bowel habit: Secondary | ICD-10-CM

## 2020-07-01 LAB — CREATININE, SERUM: Creatinine, Ser: 0.76 mg/dL (ref 0.40–1.20)

## 2020-07-01 LAB — BUN: BUN: 11 mg/dL (ref 6–23)

## 2020-07-04 ENCOUNTER — Ambulatory Visit (HOSPITAL_COMMUNITY)
Admission: RE | Admit: 2020-07-04 | Discharge: 2020-07-04 | Disposition: A | Discharge status: Home / Self Care | Payer: BC Managed Care – PPO | Source: Ambulatory Visit | Attending: Gastroenterology | Admitting: Gastroenterology

## 2020-07-04 ENCOUNTER — Other Ambulatory Visit: Payer: Self-pay

## 2020-07-04 DIAGNOSIS — R112 Nausea with vomiting, unspecified: Secondary | ICD-10-CM | POA: Insufficient documentation

## 2020-07-04 DIAGNOSIS — R194 Change in bowel habit: Secondary | ICD-10-CM | POA: Diagnosis present

## 2020-07-04 MED ORDER — IOHEXOL 300 MG/ML  SOLN
100.0000 mL | Freq: Once | INTRAMUSCULAR | Status: AC | PRN
  Administered 2020-07-04: 100 mL via INTRAVENOUS

## 2020-07-11 ENCOUNTER — Other Ambulatory Visit: Payer: Self-pay

## 2020-07-11 MED ORDER — MESALAMINE 1.2 G PO TBEC: 4.8000 g | DELAYED_RELEASE_TABLET | Freq: Every day | ORAL | 6 refills | Status: DC

## 2020-08-26 MED ORDER — BUDESONIDE 3 MG PO CPEP: 9.0000 mg | ORAL_CAPSULE | Freq: Every day | ORAL | 3 refills | Status: DC

## 2020-10-22 ENCOUNTER — Ambulatory Visit: Payer: BC Managed Care – PPO | Admitting: Gastroenterology

## 2020-10-22 ENCOUNTER — Encounter: Payer: Self-pay | Admitting: Gastroenterology

## 2020-10-22 VITALS — BP 110/70 | HR 68 | Ht 66.5 in | Wt 256.0 lb

## 2020-10-22 DIAGNOSIS — K529 Noninfective gastroenteritis and colitis, unspecified: Secondary | ICD-10-CM | POA: Diagnosis not present

## 2020-10-22 MED ORDER — BUDESONIDE 3 MG PO CPEP: ORAL_CAPSULE | ORAL | 0 refills | Status: AC

## 2020-10-22 NOTE — Progress Notes (Signed)
Review of pertinent gastrointestinal problems: 1.  Gastric ulcers, peptic ulcer disease in 2015 and 2018.  EGD [facility] 2018 to follow-up gastric ulcers was completely normal.  EGD March 2022 done for nausea and vomiting showed mild nonspecific gastritis.  Pathology was negative for H. pylori 2.  Chronic diarrhea work-up 2022 included colonoscopy March 2022 which showed inflammation in her terminal ileum.  Granular appearing colon throughout.  Polyp in the mid rectum.  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  I recommended repeat examination by flexible sigmoidoscopy at 51-monthinterval.  Biopsies from her ileum showed active inflammation only, no granulomas.  Biopsies from her colon were all completely normal without any inflammation.  CT enterography April 2022 showed "nonspecific mucosal enhancement and thickening through the distal, terminal ileum.  No submucosal fatty changes to suggest chronic inflammation.  No fistula, stricture, or abscess.  No prestenotic dilation.  Differential would include mild IBD versus nonspecific ileitis also asymmetric thickening of the distal rectum.  Lialda 4 pills once daily started   HPI: This is a very pleasant 45year old woman whom I last saw at the time of a colonoscopy for 5 months ago.  Her weight is down 23 pounds since her last office visit here 6 months ago, same scale.  She is not trying to lose weight.  She had what sounds like an acute intolerance to Lialda, felt like after she started it she was walking through but, very lethargic, coworkers mentioned how drained she appeared.  She stopped taking it and felt better within 2 or 3 days.  Instead I called in budesonide, she has been taking 9 mg of budesonide for about 2 months now.  This has significantly helped her frequency.  She was having 8 loose urgent BMs daily and now she is having only 1 urgent BM daily, this is always in the morning and as soon as she is  done that she feels fine for the rest of the day.  Her quality of life is "much much improved".  ROS: complete GI ROS as described in HPI, all other review negative.  Constitutional:  No unintentional weight loss   Past Medical History:  Diagnosis Date   Abnormal Pap smear 2003   Anemia    As a child    Anxiety    Blood transfusion 10/2003   Needed   3 units    Depression    H/O candidiasis    H/O cytomegalovirus infection    Herpes    HSV-2 infection 2011   "no outbreaks ever"   Hx: UTI (urinary tract infection)     x 1   Kidney stone    Obsessive-compulsive disorder    Postpartum anemia    Postpartum hypertension 09/20/2011   Stomach ulcer    Vulvar lesion 2009    Past Surgical History:  Procedure Laterality Date   EYE SURGERY  2000   LASIX   KNEE SURGERY  2008   TONSILLECTOMY  1986   UPPER GASTROINTESTINAL ENDOSCOPY     WISDOM TOOTH EXTRACTION     45yrs old     Current Outpatient Medications  Medication Sig Dispense Refill   atenolol (TENORMIN) 25 MG tablet Take 25 mg by mouth daily.     budesonide (ENTOCORT EC) 3 MG 24 hr capsule Take 3 capsules (9 mg total) by mouth daily. 90 capsule 3   Cholecalciferol (VITAMIN D3) 1.25 MG (50000 UT) CAPS Take by mouth.  FLUoxetine (PROZAC) 40 MG capsule Take 80 mg by mouth daily.      omeprazole (PRILOSEC OTC) 20 MG tablet Take 20 mg by mouth daily.     ondansetron (ZOFRAN) 4 MG tablet Take 1 tablet (4 mg total) by mouth every 8 (eight) hours as needed for nausea or vomiting. 40 tablet 2   topiramate (TOPAMAX) 100 MG tablet Take by mouth.     No current facility-administered medications for this visit.    Allergies as of 10/22/2020 - Review Complete 06/05/2020  Allergen Reaction Noted   Nsaids Other (See Comments) 07/14/2011    Family History  Problem Relation Age of Onset   Hypertension Mother    Mental illness Mother        Anxiety   Depression Mother    Bipolar disorder Mother    Anxiety disorder Mother     COPD Maternal Grandfather        Emphysema   OCD Father    Alcohol abuse Maternal Uncle    Drug abuse Maternal Uncle    Depression Maternal Grandmother    OCD Paternal Grandmother    Kidney disease Sister        Kidney stones    Colon cancer Sister    Kidney disease Paternal Aunt    Schizophrenia Paternal Uncle    Esophageal cancer Neg Hx    Pancreatic cancer Neg Hx    Stomach cancer Neg Hx     Social History   Socioeconomic History   Marital status: Married    Spouse name: Not on file   Number of children: Not on file   Years of education: Not on file   Highest education level: Not on file  Occupational History   Not on file  Tobacco Use   Smoking status: Never   Smokeless tobacco: Never  Vaping Use   Vaping Use: Some days  Substance and Sexual Activity   Alcohol use: No   Drug use: No   Sexual activity: Yes    Birth control/protection: None, Pill  Other Topics Concern   Not on file  Social History Narrative   Not on file   Social Determinants of Health   Financial Resource Strain: Not on file  Food Insecurity: Not on file  Transportation Needs: Not on file  Physical Activity: Not on file  Stress: Not on file  Social Connections: Not on file  Intimate Partner Violence: Not on file     Physical Exam: BP 110/70   Pulse 68   Ht 5' 6.5" (1.689 m)   Wt 256 lb (116.1 kg)   BMI 40.70 kg/m  Constitutional: generally well-appearing Psychiatric: alert and oriented x3 Abdomen: soft, nontender, nondistended, no obvious ascites, no peritoneal signs, normal bowel sounds No peripheral edema noted in lower extremities  Assessment and plan: 45 y.o. 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  2.5 mm.  Positive margins.  I recommended repeat examination of the rectum as well the rest of her colon and her terminal ileum and about 2 months from now.  I would like her  to taper completely off of the budesonide.  I explained to her that I am not comfortable labeling this as Crohn's disease given the fact that no chronic changes were noted on her terminal ileal biopsies.  I hope that a second look at her colon and her terminal ileum after she is completely off of budesonide for about 1 month  will help with this uncertainty.   Please see the "Patient Instructions" section for addition details about the plan.  Owens Loffler, MD [facility] 10/22/2020, 2:13 PM   Total time on date of encounter was 40 minutes (this included time spent preparing to see the patient reviewing records; obtaining and/or reviewing separately obtained history; performing a medically appropriate exam and/or evaluation; counseling and educating the patient and family if present; ordering medications, tests or procedures if applicable; and documenting clinical information in the health record).

## 2020-10-22 NOTE — Patient Instructions (Addendum)
If you are age 44 or older, your body mass index should be between 23-30. Your Body mass index is 40.7 kg/m. If this is out of the aforementioned range listed, please consider follow up with your Primary Care Provider.  If you are age 48 or younger, your body mass index should be between 19-25. Your Body mass index is 40.7 kg/m. If this is out of the aformentioned range listed, please consider follow up with your Primary Care Provider.   __________________________________________________________  The [facility] providers would like to encourage you to use [facility] to communicate with providers for non-urgent requests or questions.  Due to long hold times on the telephone, sending your provider a message by [facility] may be a faster and more efficient way to get a response.  Please allow 48 business hours for a response.  Please remember that this is for non-urgent requests.   You have been scheduled for a colonoscopy. Please follow written instructions given to you at your visit today.  Please pick up your prep supplies at the pharmacy within the next 1-3 days. If you use inhalers (even only as needed), please bring them with you on the day of your procedure.  Taper your budesonide: Take 6 mg daily for 2 weeks, then take 3 mg daily for 2 weeks then be off for 4 weeks until colonoscopy.    Thank you for entrusting me with your care and for choosing [facility], Dr. Oretha Caprice

## 2020-11-22 ENCOUNTER — Other Ambulatory Visit: Payer: Self-pay | Admitting: Gastroenterology

## 2021-01-10 ENCOUNTER — Encounter: Payer: Self-pay | Admitting: Gastroenterology

## 2021-01-10 ENCOUNTER — Ambulatory Visit (AMBULATORY_SURGERY_CENTER): Payer: BC Managed Care – PPO | Admitting: Gastroenterology

## 2021-01-10 ENCOUNTER — Encounter: Payer: BC Managed Care – PPO | Admitting: Gastroenterology

## 2021-01-10 ENCOUNTER — Other Ambulatory Visit: Payer: Self-pay

## 2021-01-10 VITALS — BP 115/71 | HR 61 | Temp 96.2°F | Resp 19 | Ht 66.0 in | Wt 256.0 lb

## 2021-01-10 DIAGNOSIS — K529 Noninfective gastroenteritis and colitis, unspecified: Secondary | ICD-10-CM

## 2021-01-10 DIAGNOSIS — K621 Rectal polyp: Secondary | ICD-10-CM

## 2021-01-10 DIAGNOSIS — D128 Benign neoplasm of rectum: Secondary | ICD-10-CM

## 2021-01-10 MED ORDER — SODIUM CHLORIDE 0.9 % IV SOLN: 500.0000 mL | INTRAVENOUS | Status: DC

## 2021-01-10 NOTE — Patient Instructions (Signed)
Handout was given to your care partner on polyps. You may resume your current medications today. Await biopsy results.  May take 1-3 weeks to receive pathology results. Please call if any questions or concerns.    YOU HAD AN ENDOSCOPIC PROCEDURE TODAY AT THE [facility]:   Refer to the procedure report that was given to you for any specific questions about what was found during the examination.  If the procedure report does not answer your questions, please call your gastroenterologist to clarify.  If you requested that your care partner not be given the details of your procedure findings, then the procedure report has been included in a sealed envelope for you to review at your convenience later.  YOU SHOULD EXPECT: Some feelings of bloating in the abdomen. Passage of more gas than usual.  Walking can help get rid of the air that was put into your GI tract during the procedure and reduce the bloating. If you had a lower endoscopy (such as a colonoscopy or flexible sigmoidoscopy) you may notice spotting of blood in your stool or on the toilet paper. If you underwent a bowel prep for your procedure, you may not have a normal bowel movement for a few days.  Please Note:  You might notice some irritation and congestion in your nose or some drainage.  This is from the oxygen used during your procedure.  There is no need for concern and it should clear up in a day or so.  SYMPTOMS TO REPORT IMMEDIATELY:  Following lower endoscopy (colonoscopy or flexible sigmoidoscopy):  Excessive amounts of blood in the stool  Significant tenderness or worsening of abdominal pains  Swelling of the abdomen that is new, acute  Fever of 100F or higher    For urgent or emergent issues, a gastroenterologist can be reached at any hour by calling [phone]. Do not use MyChart messaging for urgent concerns.    DIET:  We do recommend a small meal at first, but then you may proceed to your regular  diet.  Drink plenty of fluids but you should avoid alcoholic beverages for 24 hours.  ACTIVITY:  You should plan to take it easy for the rest of today and you should NOT DRIVE or use heavy machinery until tomorrow (because of the sedation medicines used during the test).    FOLLOW UP: Our staff will call the number listed on your records 48-72 hours following your procedure to check on you and address any questions or concerns that you may have regarding the information given to you following your procedure. If we do not reach you, we will leave a message.  We will attempt to reach you two times.  During this call, we will ask if you have developed any symptoms of COVID 19. If you develop any symptoms (ie: fever, flu-like symptoms, shortness of breath, cough etc.) before then, please call [phone].  If you test positive for Covid 19 in the 2 weeks post procedure, please call and report this information to us.    If any biopsies were taken you will be contacted by phone or by letter within the next 1-3 weeks.  Please call us at [phone] if you have not heard about the biopsies in 3 weeks.    SIGNATURES/CONFIDENTIALITY: You and/or your care partner have signed paperwork which will be entered into your electronic medical record.  These signatures attest to the fact that that the information above on your After Visit Summary has been   reviewed and is understood.  Full responsibility of the confidentiality of this discharge information lies with you and/or your care-partner.    

## 2021-01-10 NOTE — Op Note (Signed)
[facility] Patient Name: Krista George Procedure Date: 01/10/2021 7:25 AM MRN: [id number] Endoscopist: Milus Banister , MD Age: 45 Referring MD:  Date of Birth: 06-26-1975 Gender: Female Account #: [id number] Procedure:                Colonoscopy Indications:              Clinically significant diarrhea of unexplained                            origin, active inflammation in TI on previous                            colonoscopy. No steroids, budesonide in 2-3 months. Medicines:                Monitored Anesthesia Care Procedure:                Pre-Anesthesia Assessment:                           - Prior to the procedure, a History and Physical                            was performed, and patient medications and                            allergies were reviewed. The patient's tolerance of                            previous anesthesia was also reviewed. The risks                            and benefits of the procedure and the sedation                            options and risks were discussed with the patient.                            All questions were answered, and informed consent                            was obtained. Prior Anticoagulants: The patient has                            taken no previous anticoagulant or antiplatelet                            agents. ASA Grade Assessment: III - A patient with                            severe systemic disease. After reviewing the risks                            and benefits, the patient was deemed in  satisfactory condition to undergo the procedure.                           After obtaining informed consent, the colonoscope                            was passed under direct vision. Throughout the                            procedure, the patient's blood pressure, pulse, and                            oxygen saturations were monitored continuously. The                            CF [id number]  #[id number] was introduced through the anus                            and advanced to the the terminal ileum. The                            colonoscopy was performed without difficulty. The                            patient tolerated the procedure well. The quality                            of the bowel preparation was good. The terminal                            ileum, ileocecal valve, appendiceal orifice, and                            rectum were photographed. Scope In: 7:49:45 AM Scope Out: 8:03:01 AM Scope Withdrawal Time: 0 hours 10 minutes 53 seconds  Total Procedure Duration: 0 hours 13 minutes 16 seconds  Findings:                 The terminal ileum appeared normal except for                            lymphoid hyperplasia. This was biopsied with a cold                            forceps for histology.                           Biopsies for histology were taken with a cold                            forceps from the entire colon for evaluation of                            microscopic colitis.  A 4 mm polyp was found in the rectum. The polyp was                            sessile. The polyp was removed with a cold snare.                            Resection and retrieval were complete.                           The exam was otherwise without abnormality on                            direct and retroflexion views. Colon mucosa normal                            throughout. Complications:            No immediate complications. Estimated blood loss:                            None. Estimated Blood Loss:     Estimated blood loss: none. Impression:               - The examined portion of the ileum shopwed                            lymphoid hyperplasia, biopsies taken to check for                            active/chronic inflammation.                           - One 4 mm polyp in the rectum, removed with a cold                            snare. Resected  and retrieved.                           - The examination was otherwise normal on direct                            and retroflexion views.                           - Biopsies were taken with a cold forceps from the                            entire colon for evaluation of microscopic colitis. Recommendation:           - Patient has a contact number available for                            emergencies. The signs and symptoms of potential  delayed complications were discussed with the                            patient. Return to normal activities tomorrow.                            Written discharge instructions were provided to the                            patient.                           - Resume previous diet.                           - Continue present medications.                           - Await pathology results. Milus Banister, MD 01/10/2021 8:09:23 AM This report has been signed electronically.

## 2021-01-10 NOTE — Progress Notes (Signed)
History reviewed.  Last period was Jan 04, 2021

## 2021-01-10 NOTE — Progress Notes (Signed)
HPI: This is a woman  with abnormal ileum, loose stools   ROS: complete GI ROS as described in HPI, all other review negative.  Constitutional:  No unintentional weight loss   Past Medical History:  Diagnosis Date   Abnormal Pap smear 2003   Anemia    As a child    Anxiety    Blood transfusion 10/2003   Needed   3 units    Depression    H/O candidiasis    H/O cytomegalovirus infection    Herpes    HSV-2 infection 2011   "no outbreaks ever"   Hx: UTI (urinary tract infection)     x 1   Kidney stone    Obsessive-compulsive disorder    Postpartum anemia    Postpartum hypertension 09/20/2011   Stomach ulcer    Vulvar lesion 2009    Past Surgical History:  Procedure Laterality Date   EYE SURGERY  2000   LASIX   KNEE SURGERY  2008   TONSILLECTOMY  1986   UPPER GASTROINTESTINAL ENDOSCOPY     WISDOM TOOTH EXTRACTION     45 yrs old     Current Outpatient Medications  Medication Sig Dispense Refill   atenolol (TENORMIN) 25 MG tablet Take 25 mg by mouth daily.     Cholecalciferol (VITAMIN D3) 1.25 MG (50000 UT) CAPS Take by mouth.     FLUoxetine (PROZAC) 40 MG capsule Take 80 mg by mouth daily.      omeprazole (PRILOSEC OTC) 20 MG tablet Take 20 mg by mouth daily.     topiramate (TOPAMAX) 100 MG tablet Take by mouth.     Multiple Vitamin (MULTI VITAMIN) TABS 1 tablet     Omega-3 Fatty Acids (FISH OIL) 1000 MG CAPS 1 capsule     ondansetron (ZOFRAN) 4 MG tablet Take 1 tablet (4 mg total) by mouth every 8 (eight) hours as needed for nausea or vomiting. (Patient not taking: Reported on 01/10/2021) 40 tablet 2   Current Facility-Administered Medications  Medication Dose Route Frequency Provider Last Rate Last Admin   0.9 %  sodium chloride infusion  500 mL Intravenous Continuous Milus Banister, MD        Allergies as of 01/10/2021 - Review Complete 01/10/2021  Allergen Reaction Noted   Nsaids Other (See Comments) 07/14/2011    Family History  Problem Relation Age of  Onset   Hypertension Mother    Mental illness Mother        Anxiety   Depression Mother    Bipolar disorder Mother    Anxiety disorder Mother    OCD Father    Kidney disease Sister        Kidney stones    Colon cancer Sister    Alcohol abuse Maternal Uncle    Drug abuse Maternal Uncle    Kidney disease Paternal Aunt    Schizophrenia Paternal Uncle    Depression Maternal Grandmother    COPD Maternal Grandfather        Emphysema   OCD Paternal Grandmother    Esophageal cancer Neg Hx    Pancreatic cancer Neg Hx    Stomach cancer Neg Hx    Rectal cancer Neg Hx     Social History   Socioeconomic History   Marital status: Married    Spouse name: Not on file   Number of children: Not on file   Years of education: Not on file   Highest education level: Not on file  Occupational History  Not on file  Tobacco Use   Smoking status: Never   Smokeless tobacco: Never  Vaping Use   Vaping Use: Some days  Substance and Sexual Activity   Alcohol use: No   Drug use: No   Sexual activity: Yes    Birth control/protection: None, Pill  Other Topics Concern   Not on file  Social History Narrative   Not on file   Social Determinants of Health   Financial Resource Strain: Not on file  Food Insecurity: Not on file  Transportation Needs: Not on file  Physical Activity: Not on file  Stress: Not on file  Social Connections: Not on file  Intimate Partner Violence: Not on file     Physical Exam: BP 119/77 (BP Location: Right Arm, Patient Position: Sitting, Cuff Size: Normal)   Pulse 74   Temp (!) 96.2 F (35.7 C) (Temporal)   Ht 5\' 6"  (1.676 m)   Wt 256 lb (116.1 kg)   LMP 01/04/2021 (Exact Date)   SpO2 99%   BMI 41.32 kg/m  Constitutional: generally well-appearing Psychiatric: alert and oriented x3 Lungs: CTA bilaterally Heart: no MCR  Assessment and plan: 45 y.o. female with abdnormal ileum, loose stools  Colonsocoyp today  Care is appropriate for the ambulatory  setting.  Owens Loffler, MD [facility] 01/10/2021, 7:41 AM

## 2021-01-10 NOTE — Progress Notes (Signed)
No problems noted in the recovery room. maw 

## 2021-01-10 NOTE — Progress Notes (Signed)
Called to room to assist during endoscopic procedure.  Patient ID and intended procedure confirmed with present staff. Received instructions for my participation in the procedure from the performing physician.  

## 2021-01-10 NOTE — Progress Notes (Signed)
Report to PACU, RN, vss, BBS= Clear.  

## 2021-01-10 NOTE — Progress Notes (Deleted)
No problems noted in the recovery room. maw 

## 2021-01-14 ENCOUNTER — Telehealth: Payer: Self-pay

## 2021-01-14 NOTE — Telephone Encounter (Signed)
Called [phone] and left a message we tried to reach pt for a follow up call. maw

## 2021-01-14 NOTE — Telephone Encounter (Signed)
  Follow up Call-  Call back number 01/10/2021 06/05/2020  Post procedure Call Back phone  # [phone]  Permission to leave phone message Yes Yes  Some recent data might be hidden     Left message

## 2021-01-16 ENCOUNTER — Encounter: Payer: Self-pay | Admitting: Gastroenterology

## 2021-11-16 IMAGING — CT CT ENTEROGRAPHY (ABD-PELV W/ CM)
2 of 6 series · 15 of 46 positions shown, 17 images · IV contrast (APPLIED)
Comparison: CT 05/17/2009

CLINICAL DATA: Chronic bowel symptoms including diarrhea. Aphthous
ulcers. Concern for inflammatory bowel disease.

EXAM:
CT ABDOMEN AND PELVIS WITH CONTRAST (ENTEROGRAPHY)
TECHNIQUE: Multidetector CT of the abdomen and pelvis during bolus
administration of intravenous contrast. Negative oral contrast was
given.
CONTRAST:  100mL OMNIPAQUE IOHEXOL 300 MG/ML  SOLN

[Series 4: thins · axial · 0.86mm/px · z∈[-510,-18]mm · 12 of 544 slices shown, 14 images]
[im 26/544  soft-tissue]
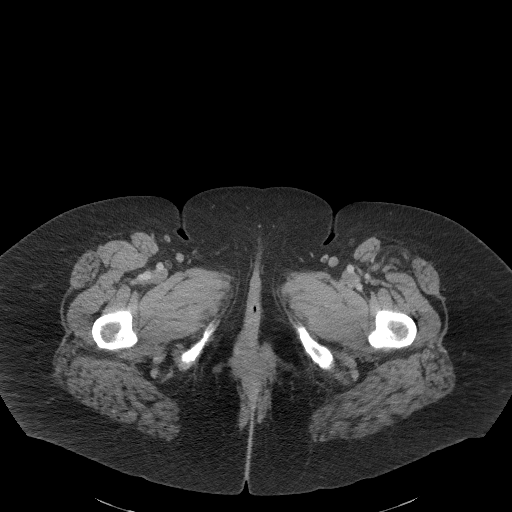
[im 26/544  bone]
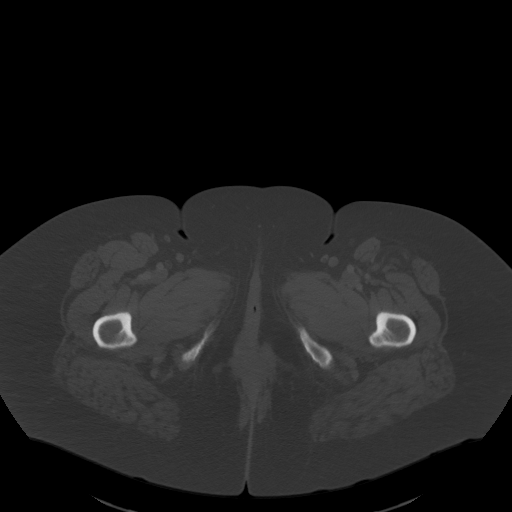
[im 78/544  soft-tissue]
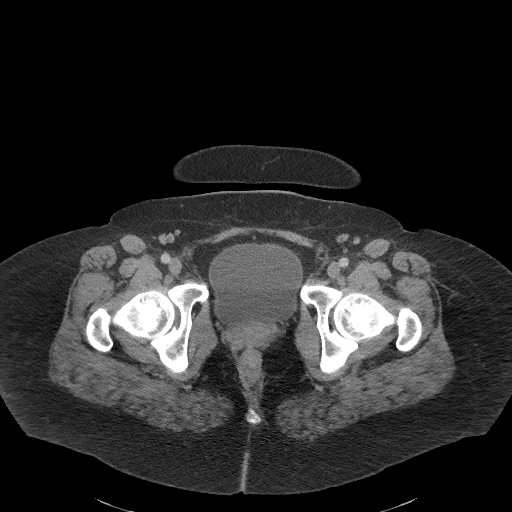
[im 130/544  soft-tissue]
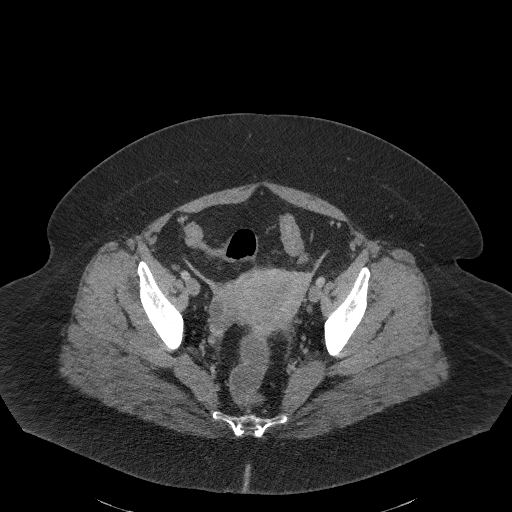
[im 156/544  soft-tissue]
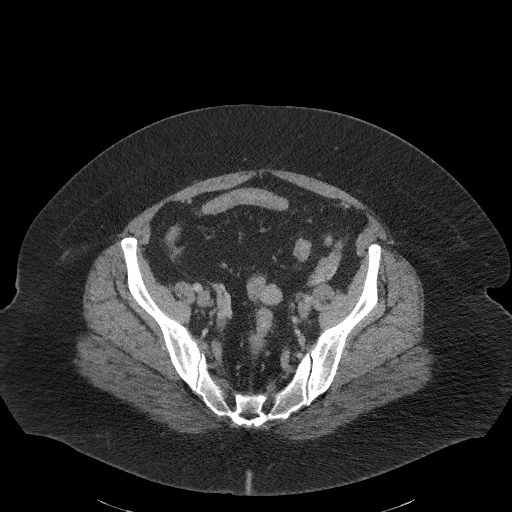
[im 207/544  soft-tissue]
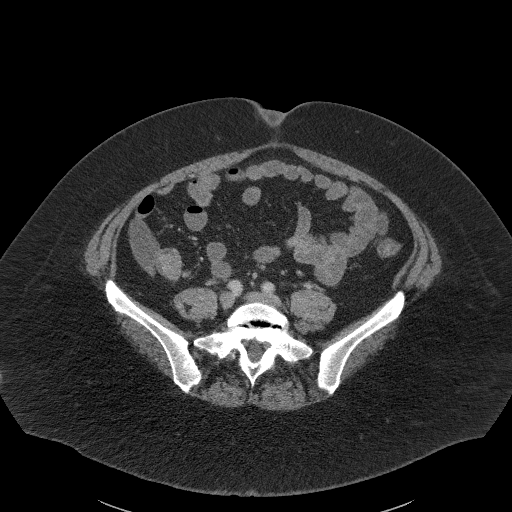
[im 259/544  soft-tissue]
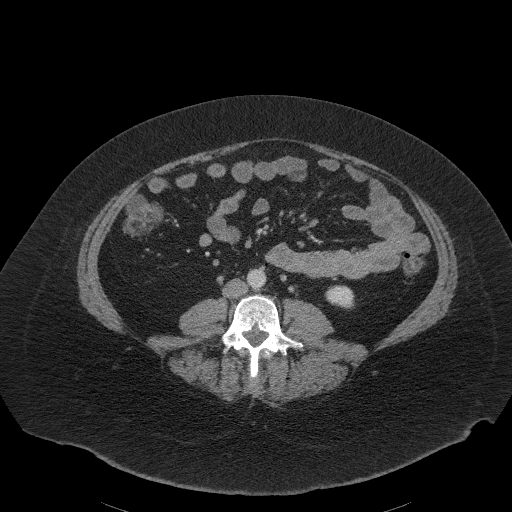
[im 285/544  soft-tissue]
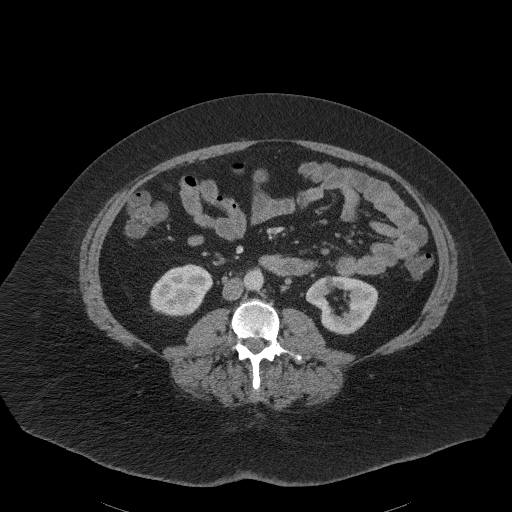
[im 337/544  soft-tissue]
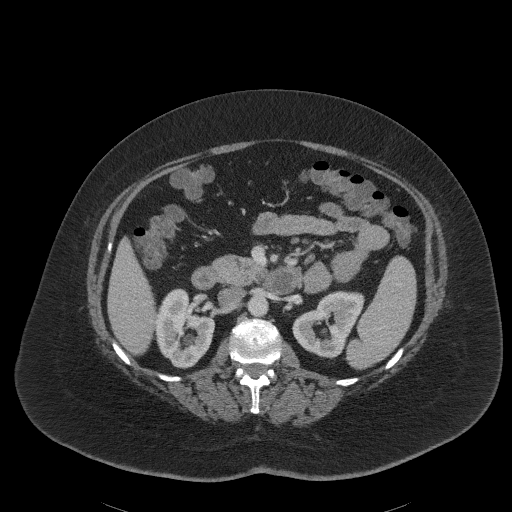
[im 388/544  soft-tissue]
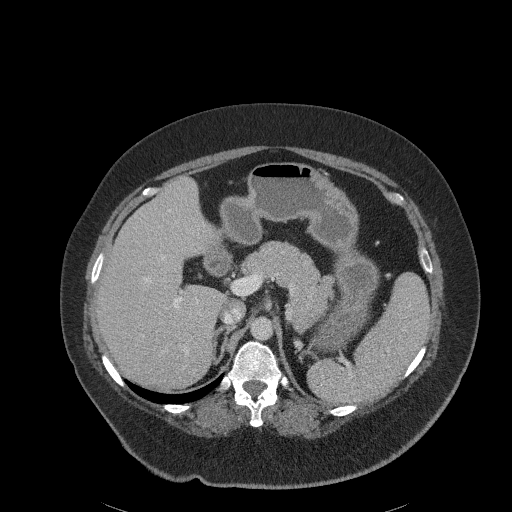
[im 388/544  bone]
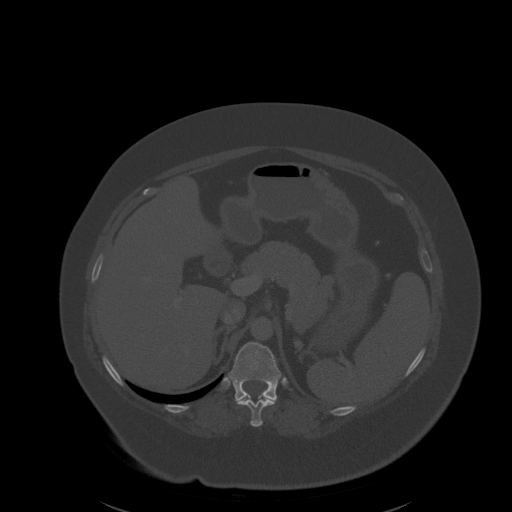
[im 414/544  soft-tissue]
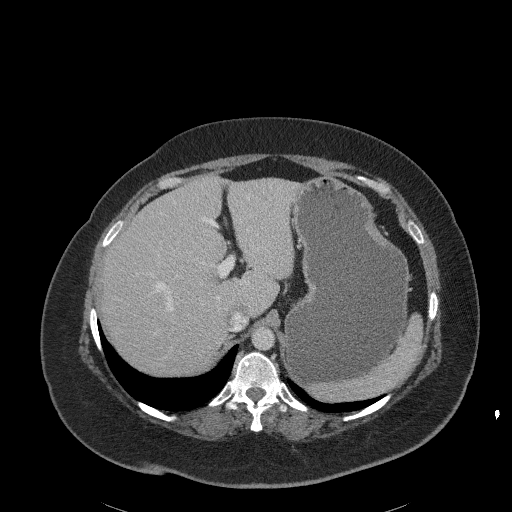
[im 466/544  soft-tissue]
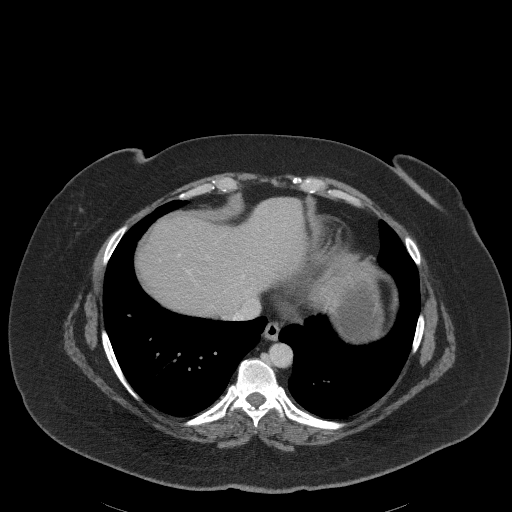
[im 518/544  soft-tissue]
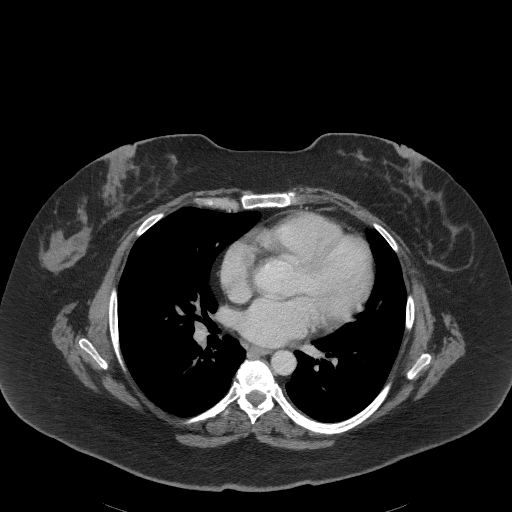

[Series 7: coronal · coronal · 0.89mm/px · 3 of 119 slices shown]
[im 40/119  soft-tissue]
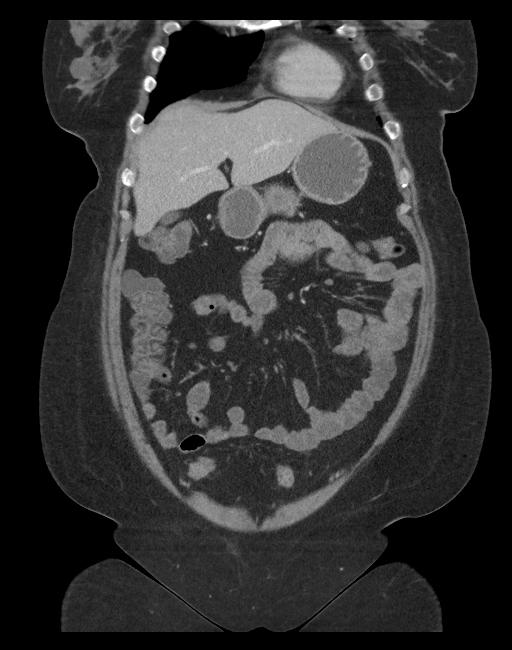
[im 53/119  soft-tissue]
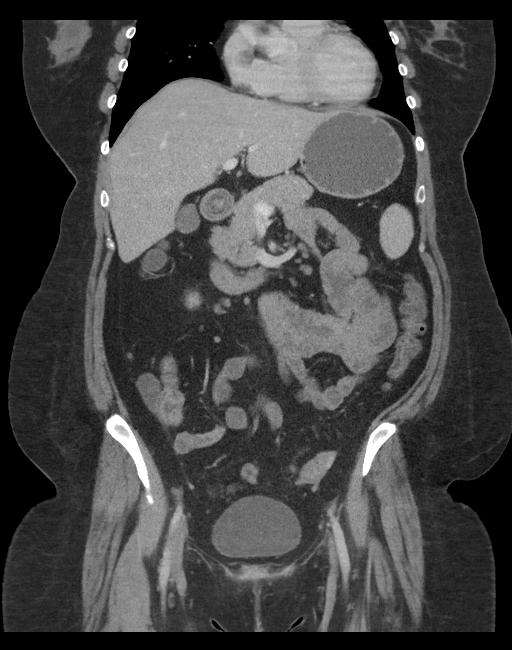
[im 66/119  soft-tissue]
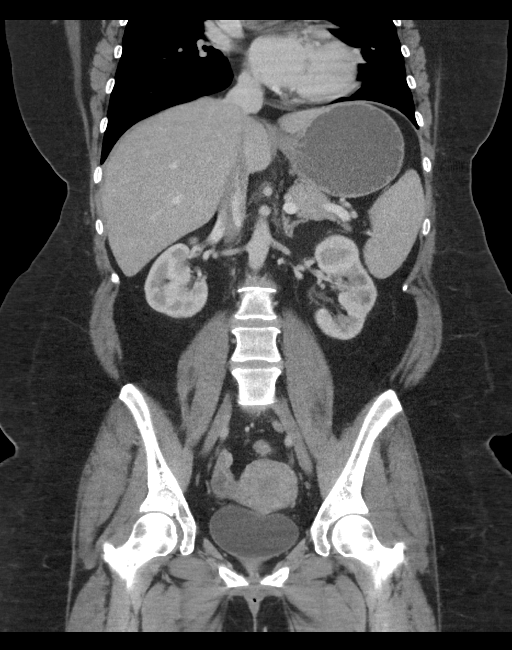

[15 of 46 positions shown; findings below may reference images not displayed]

FINDINGS: Lower chest: Lung bases are clear.

Hepatobiliary: No focal hepatic lesion. No biliary duct dilatation.
Common bile duct is normal.

Pancreas: Pancreas is normal. No ductal dilatation. No pancreatic
inflammation.

Spleen: Normal spleen

Adrenals/urinary tract: Adrenal glands and kidneys are normal. The
ureters and bladder normal.

Stomach/Bowel: Good GI track is distended by negative oral contrast.

Normal rugal pattern within the stomach. Duodenum is normal. Normal
mucosal pattern within the jejunum. Normal mucosal pattern within
the ileum. No stricture or dilatation in the jejunum or ileum.

There is mucosal enhancement in the distal ileum leading up to the
terminal ileum over approximately 5-6 cm. Mild mucosal thickening
associated with enhancement. (Image 308/series 4 and image
51/coronal series 7). There is no evidence of stricturing or pre
stenotic dilatation associated with the terminal ileum mucosal
enhancement. There is no submucosal fatty change to suggest chronic
inflammation. No fistula.

Appendix is normal. The ascending transverse and descending colon
normal. Sigmoid colon normal.

Within the distal rectum there is asymmetric mucosal thickening
along the RIGHT side of the rectum from approximately [DATE] to [DATE].
Thickening to 7 mm (image 429/series 4).

Vascular/Lymphatic: Abdominal aorta is normal caliber. No periportal
or retroperitoneal adenopathy. No pelvic adenopathy. A small
ileocecal lymph nodes within normal limits.

Reproductive: Uterus and ovaries are unremarkable.

Other: No free fluid.

Musculoskeletal: Sacroiliac joints are normal. No aggressive osseous
lesion.
IMPRESSION: 1. Nonspecific mucosal enhancement and thickening through the distal
ileum/terminal ileum. No sub mucosal fatty change to suggest chronic
inflammation. No fistula, stricture, or abscess. No pre stenotic
dilatation. Differential would include mild inflammatory bowel
disease versus nonspecific ileitis.
2. No additional abnormality in the stomach, duodenum small-bowel or
colon.
3. Asymmetric mucosal thickening of the distal rectum along the
RIGHT wall. Recommend correlation with recent colonoscopy.

## 2022-10-20 ENCOUNTER — Other Ambulatory Visit (INDEPENDENT_AMBULATORY_CARE_PROVIDER_SITE_OTHER): Payer: 59

## 2022-10-20 ENCOUNTER — Ambulatory Visit: Payer: 59 | Admitting: Gastroenterology

## 2022-10-20 ENCOUNTER — Encounter: Payer: Self-pay | Admitting: Gastroenterology

## 2022-10-20 VITALS — BP 110/84 | HR 68 | Ht 66.0 in | Wt 199.2 lb

## 2022-10-20 DIAGNOSIS — R634 Abnormal weight loss: Secondary | ICD-10-CM

## 2022-10-20 DIAGNOSIS — D3A8 Other benign neuroendocrine tumors: Secondary | ICD-10-CM

## 2022-10-20 DIAGNOSIS — R1013 Epigastric pain: Secondary | ICD-10-CM | POA: Diagnosis not present

## 2022-10-20 DIAGNOSIS — D509 Iron deficiency anemia, unspecified: Secondary | ICD-10-CM

## 2022-10-20 DIAGNOSIS — R197 Diarrhea, unspecified: Secondary | ICD-10-CM

## 2022-10-20 LAB — IBC + FERRITIN
Ferritin: 21.2 ng/mL (ref 10.0–291.0)
Iron: 108 ug/dL (ref 42–145)
Saturation Ratios: 36.9 % (ref 20.0–50.0)
TIBC: 292.6 ug/dL (ref 250.0–450.0)
Transferrin: 209 mg/dL — ABNORMAL LOW (ref 212.0–360.0)

## 2022-10-20 LAB — CBC WITH DIFFERENTIAL/PLATELET
Basophils Absolute: 0.1 10*3/uL (ref 0.0–0.1)
Basophils Relative: 1.2 % (ref 0.0–3.0)
Eosinophils Absolute: 1 10*3/uL — ABNORMAL HIGH (ref 0.0–0.7)
Eosinophils Relative: 14.2 % — ABNORMAL HIGH (ref 0.0–5.0)
HCT: 40.2 % (ref 36.0–46.0)
Hemoglobin: 12.8 g/dL (ref 12.0–15.0)
Lymphocytes Relative: 24.9 % (ref 12.0–46.0)
Lymphs Abs: 1.7 10*3/uL (ref 0.7–4.0)
MCHC: 31.9 g/dL (ref 30.0–36.0)
MCV: 91.4 fl (ref 78.0–100.0)
Monocytes Absolute: 0.5 10*3/uL (ref 0.1–1.0)
Monocytes Relative: 7.1 % (ref 3.0–12.0)
Neutro Abs: 3.6 10*3/uL (ref 1.4–7.7)
Neutrophils Relative %: 52.6 % (ref 43.0–77.0)
Platelets: 323 10*3/uL (ref 150.0–400.0)
RBC: 4.4 Mil/uL (ref 3.87–5.11)
RDW: 14.4 % (ref 11.5–15.5)
WBC: 6.9 10*3/uL (ref 4.0–10.5)

## 2022-10-20 LAB — C-REACTIVE PROTEIN: CRP: <1 mg/dL (ref 0.5–20.0)

## 2022-10-20 LAB — SEDIMENTATION RATE: Sed Rate: 9 mm/hr (ref 0–20)

## 2022-10-20 MED ORDER — NA SULFATE-K SULFATE-MG SULF 17.5-3.13-1.6 GM/177ML PO SOLN: 1.0000 | ORAL | 0 refills | Status: DC

## 2022-10-20 NOTE — Patient Instructions (Signed)
You have been scheduled for a CT scan of the abdomen and pelvis at [facility], 1st floor Radiology. You are scheduled on October 28, 2022 at 1pm. Please arrive at 11:30 am  1) Do not eat anything after 9 am (4 hours prior to your test)    You may take any medications as prescribed with a small amount of water, if necessary. If you take any of the following medications: METFORMIN, GLUCOPHAGE, GLUCOVANCE, AVANDAMET, RIOMET, FORTAMET, ACTOPLUS MET, JANUMET, GLUMETZA or METAGLIP, you MAY be asked to HOLD this medication 48 hours AFTER the exam.   The purpose of you drinking the oral contrast is to aid in the visualization of your intestinal tract. The contrast solution may cause some diarrhea. Depending on your individual set of symptoms, you may also receive an intravenous injection of x-ray contrast/dye. Plan on being at [facility] for 45 minutes or longer, depending on the type of exam you are having performed.   If you have any questions regarding your exam or if you need to reschedule, you may call [facility] at [phone] between the hours of 8:00 am and 5:00 pm, Monday-Friday.   You have been scheduled for an endoscopy and colonoscopy. Please follow the written instructions given to you at your visit today.  Please pick up your prep supplies at the pharmacy within the next 1-3 days.  If you use inhalers (even only as needed), please bring them with you on the day of your procedure.  DO NOT TAKE 7 DAYS PRIOR TO TEST- Trulicity (dulaglutide) Ozempic, Wegovy (semaglutide) Mounjaro (tirzepatide) Bydureon Bcise (exanatide extended release)  DO NOT TAKE 1 DAY PRIOR TO YOUR TEST Rybelsus (semaglutide) Adlyxin (lixisenatide) Victoza (liraglutide) Byetta (exanatide) ___________________________________________________________________________   Your provider has requested that you go to the basement level for lab work before leaving today. Press "B" on the elevator. The  lab is located at the first door on the left as you exit the elevator.  Due to recent changes in healthcare laws, you may see the results of your imaging and laboratory studies on MyChart before your provider has had a chance to review them.  We understand that in some cases there may be results that are confusing or concerning to you. Not all laboratory results come back in the same time frame and the provider may be waiting for multiple results in order to interpret others.  Please give [place] 48 hours in order for your provider to thoroughly review all the results before contacting the office for clarification of your results.    It was a pleasure to see you today!  Thank you for trusting me with your gastrointestinal care!

## 2022-10-20 NOTE — Progress Notes (Signed)
Assessment     Weight loss of 100 pounds which stabilized in February 2024 Diarrhea. History of nonspecific ileitis. 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 The risks (including bleeding, perforation, infection, missed lesions, medication reactions and possible hospitalization or surgery if complications occur), benefits, and alternatives to colonoscopy with possible biopsy and possible polypectomy were discussed with the patient and they consent to proceed.  The risks (including bleeding, perforation, infection, missed lesions, medication reactions and possible hospitalization or surgery if complications occur), benefits, and alternatives to endoscopy with possible biopsy and possible dilation were discussed with the patient and they consent to proceed.   Continue omeprazole 20 mg qd   HPI    This is a 47 year old female with weight loss, diarrhea, chest pain, epigastric pain and anemia. Patient of Dr. Christella Hartigan.  She has a history of history of gastric ulcers and chronic diarrhea.  She reports mild diarrhea with 3 semiformed bowel movements daily for the past several months.  She reports a 100 pound weight loss over the past 2 years however since February her weight has stabilized around 195 pounds.  She notes substernal and epigastric discomfort intermittently but does not feel like reflux.  The pain is not related to meals. Colonoscopy by Dr. Christella Hartigan in March 2022 showed ileal erosions with biopsy showing nonspecific ileitis.  She was intolerant of Lialda.  Following that she was treated with a course of budesonide and had  significant improvement in her diarrhea.  Repeat colonoscopy off budesonide for 2 to 3 months in November 2022 showed ileal lymphoid hyperplasia with biopsies showing unremarkable ileal mucosa.  Random colon biopsies were negative.   CMP, TSH, B12 were normal in June 2024. CBC showed Hgb=11.9 in June 2024 and she restarted Fe.  She has a history of iron deficiency anemia. Denies abdominal pain, constipation, change in stool caliber, melena, hematochezia, nausea, vomiting, dysphagia.   Labs / Imaging       Latest Ref Rng & Units 09/09/2011    7:28 PM 09/04/2011    5:40 PM 07/19/2011    9:43 AM  Hepatic Function  Total Protein 6.0 - 8.3 g/dL 5.8  4.5  5.7   Albumin 3.5 - 5.2 g/dL 2.6  2.0  2.6   AST 0 - 37 U/L 19  15  13    ALT 0 - 35 U/L 35  14  15   Alk Phosphatase 39 - 117 U/L 97  91  76   Total Bilirubin 0.3 - 1.2 mg/dL 0.2  0.1  0.2        Latest Ref Rng & Units 09/09/2011    7:28 PM 09/04/2011    5:40 PM 09/04/2011    5:35 AM  CBC  WBC 4.0 - 10.5 K/uL 8.8  11.2  13.1   Hemoglobin 12.0 - 15.0 g/dL 9.9  9.0  9.0   Hematocrit 36.0 - 46.0 % 29.6  26.4  26.5   Platelets 150 - 400 K/uL 269  214  199      CT ENTERO ABD/PELVIS W CONTAST CLINICAL DATA:  Chronic bowel symptoms including diarrhea. Aphthous ulcers. Concern for inflammatory bowel disease.  EXAM:  CT ABDOMEN AND PELVIS WITH CONTRAST (ENTEROGRAPHY)  TECHNIQUE: Multidetector CT of the abdomen and pelvis during bolus administration of intravenous contrast. Negative oral contrast was given.  CONTRAST:  OMNIPAQUE IOHEXOL 300 MG/ML  SOLN  COMPARISON:  CT 05/17/2009  FINDINGS: Lower chest: Lung bases are clear.  Hepatobiliary: No focal hepatic lesion. No biliary duct dilatation. Common bile duct is normal.  Pancreas: Pancreas is normal. No ductal dilatation. No pancreatic inflammation.  Spleen: Normal spleen  Adrenals/urinary tract: Adrenal glands and kidneys are normal. The ureters and bladder  normal.  Stomach/Bowel: Good GI track is distended by negative oral contrast.  Normal rugal pattern within the stomach. Duodenum is normal. Normal mucosal pattern within the jejunum. Normal mucosal pattern within the ileum. No stricture or dilatation in the jejunum or ileum.  There is mucosal enhancement in the distal ileum leading up to the terminal ileum over approximately 5-6 cm. Mild mucosal thickening associated with enhancement. (Image 308/series 4 and image 51/coronal series 7). There is no evidence of stricturing or pre stenotic dilatation associated with the terminal ileum mucosal enhancement. There is no submucosal fatty change to suggest chronic inflammation. No fistula.  Appendix is normal. The ascending transverse and descending colon normal. Sigmoid colon normal.  Within the distal rectum there is asymmetric mucosal thickening along the RIGHT side of the rectum from approximately 7:00 to 12:00. Thickening to 7 mm (image 429/series 4).  Vascular/Lymphatic: Abdominal aorta is normal caliber. No periportal or retroperitoneal adenopathy. No pelvic adenopathy. A small ileocecal lymph nodes within normal limits.  Reproductive: Uterus and ovaries are unremarkable.  Other: No free fluid.  Musculoskeletal: Sacroiliac joints are normal. No aggressive osseous lesion.  IMPRESSION: 1. Nonspecific mucosal enhancement and thickening through the distal ileum/terminal ileum. No sub mucosal fatty change to suggest chronic inflammation. No fistula, stricture, or abscess. No pre stenotic dilatation. Differential would include mild inflammatory bowel disease versus nonspecific ileitis. 2. No additional abnormality in the stomach, duodenum small-bowel or colon. 3. Asymmetric mucosal thickening of the distal rectum along the RIGHT wall. Recommend correlation with recent colonoscopy.  Electronically Signed   By: Genevive Bi M.D.   On: 07/05/2020 08:35  Current Medications,  Allergies, Past Medical History, Past Surgical History, Family History and Social History were reviewed in [employer] record.   Physical Exam: General: Well developed, well nourished, no acute distress Head: Normocephalic and atraumatic Eyes: Sclerae anicteric, EOMI Ears: Normal auditory acuity Mouth: No deformities or lesions noted Lungs: Clear throughout to auscultation Heart: Regular rate and rhythm; No murmurs, rubs or bruits Abdomen: Soft, non tender and non distended. No masses, hepatosplenomegaly or hernias noted. Normal Bowel sounds Rectal: Deferred to colonoscopy Musculoskeletal: Symmetrical with no gross deformities  Pulses:  Normal pulses noted Extremities: No edema or deformities noted Neurological: Alert oriented x 4, grossly nonfocal Psychological:  Alert and cooperative. Normal mood and affect    T. Russella Dar, MD 10/20/2022, 10:14 AM

## 2022-10-22 ENCOUNTER — Telehealth: Payer: Self-pay

## 2022-10-22 NOTE — Telephone Encounter (Signed)
-----   Message from [place] T. Russella Dar sent at 10/22/2022  1:55 PM EDT ----- Regarding: FW: tTG negative ----- Message ----- From: Interface, Lab In Three Zero One Sent: 10/20/2022  12:33 PM EDT To: Meryl Dare, MD

## 2022-10-22 NOTE — Telephone Encounter (Signed)
The pt has been advised of the lab results via My Chart. She does view her messages

## 2022-10-28 ENCOUNTER — Ambulatory Visit (HOSPITAL_COMMUNITY)
Admission: RE | Admit: 2022-10-28 | Discharge: 2022-10-28 | Disposition: A | Discharge status: Home / Self Care | Payer: 59 | Source: Ambulatory Visit | Attending: Gastroenterology | Admitting: Gastroenterology

## 2022-10-28 DIAGNOSIS — R197 Diarrhea, unspecified: Secondary | ICD-10-CM | POA: Insufficient documentation

## 2022-10-28 DIAGNOSIS — R634 Abnormal weight loss: Secondary | ICD-10-CM | POA: Insufficient documentation

## 2022-10-28 DIAGNOSIS — R1013 Epigastric pain: Secondary | ICD-10-CM | POA: Diagnosis present

## 2022-10-28 MED ORDER — IOHEXOL 300 MG/ML  SOLN
100.0000 mL | Freq: Once | INTRAMUSCULAR | Status: AC | PRN
  Administered 2022-10-28: 100 mL via INTRAVENOUS

## 2022-10-28 MED ORDER — SODIUM CHLORIDE (PF) 0.9 % IJ SOLN
INTRAMUSCULAR | Status: AC
  Filled 2022-10-28: qty 50

## 2022-10-28 MED ORDER — BARIUM SULFATE 0.1 % PO SUSP
1350.0000 mL | Freq: Once | ORAL | Status: AC
  Administered 2022-10-28: 1350 mL via ORAL

## 2022-11-05 ENCOUNTER — Encounter: Payer: Self-pay | Admitting: Gastroenterology

## 2022-11-06 ENCOUNTER — Telehealth: Payer: Self-pay

## 2022-11-06 NOTE — Telephone Encounter (Signed)
The pt has been advised of the results via My Chart- she does view messages

## 2022-11-06 NOTE — Telephone Encounter (Signed)
-----   Message from [place] T. Russella Dar sent at 11/05/2022  5:40 PM EDT ----- Regarding: FW: See impression. No GI findings.  Small pericardial effusion - follow up with PCP  Kidney stones - follow up with PCP ----- Message ----- From: Interface, Rad Results In Sent: 11/05/2022   2:28 PM EDT To: Meryl Dare, MD

## 2022-11-18 ENCOUNTER — Ambulatory Visit (AMBULATORY_SURGERY_CENTER): Payer: 59 | Admitting: Gastroenterology

## 2022-11-18 ENCOUNTER — Encounter: Payer: Self-pay | Admitting: Gastroenterology

## 2022-11-18 VITALS — BP 110/65 | HR 77 | Temp 98.1°F | Resp 23 | Ht 66.0 in | Wt 199.0 lb

## 2022-11-18 DIAGNOSIS — D125 Benign neoplasm of sigmoid colon: Secondary | ICD-10-CM

## 2022-11-18 DIAGNOSIS — R197 Diarrhea, unspecified: Secondary | ICD-10-CM

## 2022-11-18 DIAGNOSIS — K635 Polyp of colon: Secondary | ICD-10-CM | POA: Diagnosis present

## 2022-11-18 DIAGNOSIS — R079 Chest pain, unspecified: Secondary | ICD-10-CM

## 2022-11-18 DIAGNOSIS — R634 Abnormal weight loss: Secondary | ICD-10-CM

## 2022-11-18 DIAGNOSIS — D509 Iron deficiency anemia, unspecified: Secondary | ICD-10-CM

## 2022-11-18 DIAGNOSIS — K319 Disease of stomach and duodenum, unspecified: Secondary | ICD-10-CM | POA: Diagnosis not present

## 2022-11-18 DIAGNOSIS — K621 Rectal polyp: Secondary | ICD-10-CM | POA: Diagnosis not present

## 2022-11-18 DIAGNOSIS — D128 Benign neoplasm of rectum: Secondary | ICD-10-CM

## 2022-11-18 MED ORDER — SODIUM CHLORIDE 0.9 % IV SOLN: 500.0000 mL | Freq: Once | INTRAVENOUS | Status: DC

## 2022-11-18 NOTE — Progress Notes (Signed)
Sedate, gd SR, tolerated procedure well, VSS, report to RN 

## 2022-11-18 NOTE — Op Note (Signed)
[facility] Patient Name: Krista George Procedure Date: 11/18/2022 2:44 PM MRN: [id number] Endoscopist: Meryl Dare , MD, [phone] Age: 47 Referring MD:  Date of Birth: 1976-01-23 Gender: Female Account #: [id number] Procedure:                Colonoscopy Indications:              Clinically significant diarrhea of unexplained                            origin, Unexplained iron deficiency anemia, Weight                            loss Medicines:                Monitored Anesthesia Care Procedure:                Pre-Anesthesia Assessment:                           - Prior to the procedure, a History and Physical                            was performed, and patient medications and                            allergies were reviewed. The patient's tolerance of                            previous anesthesia was also reviewed. The risks                            and benefits of the procedure and the sedation                            options and risks were discussed with the patient.                            All questions were answered, and informed consent                            was obtained. Prior Anticoagulants: The patient has                            taken no anticoagulant or antiplatelet agents. ASA                            Grade Assessment: I - A normal, healthy patient.                            After reviewing the risks and benefits, the patient                            was deemed in satisfactory condition to undergo the  procedure.                           After obtaining informed consent, the colonoscope                            was passed under direct vision. Throughout the                            procedure, the patient's blood pressure, pulse, and                            oxygen saturations were monitored continuously. The                            CF [id number] #[id number] was introduced through the anus                             and advanced to the the terminal ileum, with                            identification of the appendiceal orifice and IC                            valve. The terminal ileum, ileocecal valve,                            appendiceal orifice, and rectum were photographed.                            The quality of the bowel preparation was good. The                            colonoscopy was performed without difficulty. The                            patient tolerated the procedure well. Scope In: 2:49:12 PM Scope Out: 3:08:25 PM Scope Withdrawal Time: 0 hours 17 minutes 5 seconds  Total Procedure Duration: 0 hours 19 minutes 13 seconds  Findings:                 The perianal and digital rectal examinations were                            normal.                           Three sessile polyps were found in the rectum (1)                            and sigmoid colon (2). The polyps were 6 to 7 mm in                            size. These polyps were removed with a cold snare.  Resection and retrieval were complete.                           A localized area of mucosa in the terminal ileum                            was mildly erythematous and lymphoid hyperplasia                            was noted. Biopsies were taken with a cold forceps                            for histology.                           Internal hemorrhoids were found during                            retroflexion. The hemorrhoids were small and Grade                            I (internal hemorrhoids that do not prolapse).                           The exam was otherwise without abnormality on                            direct and retroflexion views. Random biopsies were                            taken with a cold forceps for histology Complications:            No immediate complications. Estimated blood loss:                            None. Estimated Blood Loss:     Estimated  blood loss: none. Impression:               - Three 6 to 7 mm polyps in the rectum and in the                            sigmoid colon, removed with a cold snare. Resected                            and retrieved.                           - Erythematous mucosa in the terminal ileum.                            Biopsied.                           - Internal hemorrhoids.                           -  The examination was otherwise normal on direct                            and retroflexion views. Recommendation:           - Repeat colonoscopy after studies are complete for                            surveillance based on pathology results.                           - Patient has a contact number available for                            emergencies. The signs and symptoms of potential                            delayed complications were discussed with the                            patient. Return to normal activities tomorrow.                            Written discharge instructions were provided to the                            patient.                           - Resume previous diet.                           - Continue present medications.                           - Await pathology results. Meryl Dare, MD 11/18/2022 3:23:04 PM This report has been signed electronically.

## 2022-11-18 NOTE — Progress Notes (Signed)
PT was noted to have a small Pericardial Effusion found on CT scan from 11/05/22. She has followed up with her PCP and EKG was normal as well as recent labs. Tim Management consultant and Dr. Russella Dar notified.

## 2022-11-18 NOTE — Patient Instructions (Signed)
Discharge instructions given. Handouts on polyps and Hemorrhoids. Resume previous medications. YOU HAD AN ENDOSCOPIC PROCEDURE TODAY AT THE [facility]:   Refer to the procedure report that was given to you for any specific questions about what was found during the examination.  If the procedure report does not answer your questions, please call your gastroenterologist to clarify.  If you requested that your care partner not be given the details of your procedure findings, then the procedure report has been included in a sealed envelope for you to review at your convenience later.  YOU SHOULD EXPECT: Some feelings of bloating in the abdomen. Passage of more gas than usual.  Walking can help get rid of the air that was put into your GI tract during the procedure and reduce the bloating. If you had a lower endoscopy (such as a colonoscopy or flexible sigmoidoscopy) you may notice spotting of blood in your stool or on the toilet paper. If you underwent a bowel prep for your procedure, you may not have a normal bowel movement for a few days.  Please Note:  You might notice some irritation and congestion in your nose or some drainage.  This is from the oxygen used during your procedure.  There is no need for concern and it should clear up in a day or so.  SYMPTOMS TO REPORT IMMEDIATELY:  Following lower endoscopy (colonoscopy or flexible sigmoidoscopy):  Excessive amounts of blood in the stool  Significant tenderness or worsening of abdominal pains  Swelling of the abdomen that is new, acute  Fever of 100F or higher  Following upper endoscopy (EGD)  Vomiting of blood or coffee ground material  New chest pain or pain under the shoulder blades  Painful or persistently difficult swallowing  New shortness of breath  Fever of 100F or higher  Black, tarry-looking stools  For urgent or emergent issues, a gastroenterologist can be reached at any hour by calling [phone]. Do not use  MyChart messaging for urgent concerns.    DIET:  We do recommend a small meal at first, but then you may proceed to your regular diet.  Drink plenty of fluids but you should avoid alcoholic beverages for 24 hours.  ACTIVITY:  You should plan to take it easy for the rest of today and you should NOT DRIVE or use heavy machinery until tomorrow (because of the sedation medicines used during the test).    FOLLOW UP: Our staff will call the number listed on your records the next business day following your procedure.  We will call around 7:15- 8:00 am to check on you and address any questions or concerns that you may have regarding the information given to you following your procedure. If we do not reach you, we will leave a message.     If any biopsies were taken you will be contacted by phone or by letter within the next 1-3 weeks.  Please call us at [phone] if you have not heard about the biopsies in 3 weeks.    SIGNATURES/CONFIDENTIALITY: You and/or your care partner have signed paperwork which will be entered into your electronic medical record.  These signatures attest to the fact that that the information above on your After Visit Summary has been reviewed and is understood.  Full responsibility of the confidentiality of this discharge information lies with you and/or your care-partner.

## 2022-11-18 NOTE — Progress Notes (Signed)
See 10/20/2022 H&P no changes

## 2022-11-18 NOTE — Op Note (Signed)
[facility] Patient Name: Krista George Procedure Date: 11/18/2022 2:45 PM MRN: [id number] Endoscopist: Meryl Dare , MD, [phone] Age: 47 Referring MD:  Date of Birth: 01/11/1976 Gender: Female Account #: [id number] Procedure:                Upper GI endoscopy Indications:              Unexplained iron deficiency anemia, Unexplained                            chest pain, Diarrhea, Weight loss Medicines:                Monitored Anesthesia Care Procedure:                Pre-Anesthesia Assessment:                           - Prior to the procedure, a History and Physical                            was performed, and patient medications and                            allergies were reviewed. The patient's tolerance of                            previous anesthesia was also reviewed. The risks                            and benefits of the procedure and the sedation                            options and risks were discussed with the patient.                            All questions were answered, and informed consent                            was obtained. Prior Anticoagulants: The patient has                            taken no anticoagulant or antiplatelet agents. ASA                            Grade Assessment: I - A normal, healthy patient.                            After reviewing the risks and benefits, the patient                            was deemed in satisfactory condition to undergo the                            procedure.                           -  Prior to the procedure, a History and Physical                            was performed, and patient medications and                            allergies were reviewed. The patient's tolerance of                            previous anesthesia was also reviewed. The risks                            and benefits of the procedure and the sedation                            options and risks were discussed with  the patient.                            All questions were answered, and informed consent                            was obtained. Prior Anticoagulants: The patient has                            taken no anticoagulant or antiplatelet agents. ASA                            Grade Assessment: I - A normal, healthy patient.                            After reviewing the risks and benefits, the patient                            was deemed in satisfactory condition to undergo the                            procedure.                           After obtaining informed consent, the endoscope was                            passed under direct vision. Throughout the                            procedure, the patient's blood pressure, pulse, and                            oxygen saturations were monitored continuously. The                            GIF [id number] #[id number] was introduced through the  mouth, and advanced to the second part of duodenum.                            The upper GI endoscopy was accomplished without                            difficulty. The patient tolerated the procedure                            well. Scope In: Scope Out: Findings:                 The examined esophagus was normal.                           Patchy mildly erythematous mucosa without bleeding                            was found in the gastric fundus and in the gastric                            body. Biopsies were taken with a cold forceps for                            histology.                           A small hiatal hernia was present.                           The exam of the stomach was otherwise normal.                           The duodenal bulb and second portion of the                            duodenum were normal. Biopsies for histology were                            taken with a cold forceps for evaluation of celiac                             disease. Complications:            No immediate complications. Estimated Blood Loss:     Estimated blood loss was minimal. Impression:               - Normal esophagus.                           - Erythematous mucosa in the gastric fundus and                            gastric body. Biopsied.                           - Small hiatal  hernia.                           - Normal duodenal bulb and second portion of the                            duodenum. Biopsied. Recommendation:           - Patient has a contact number available for                            emergencies. The signs and symptoms of potential                            delayed complications were discussed with the                            patient. Return to normal activities tomorrow.                            Written discharge instructions were provided to the                            patient.                           - Resume previous diet.                           - Follow antireflux measures.                           - Continue present medications.                           - Await pathology results. Meryl Dare, MD 11/18/2022 3:26:31 PM This report has been signed electronically.

## 2022-11-18 NOTE — Progress Notes (Signed)
Called to room to assist during endoscopic procedure.  Patient ID and intended procedure confirmed with present staff. Received instructions for my participation in the procedure from the performing physician.  

## 2022-11-19 ENCOUNTER — Telehealth: Payer: Self-pay

## 2022-11-19 NOTE — Telephone Encounter (Signed)
Follow up call to pt, lm for pt to call if having any difficulty with normal activities or eating and drinking.  Also to call if any other questions or concerns.  

## 2022-11-24 LAB — SURGICAL PATHOLOGY

## 2022-12-03 ENCOUNTER — Encounter: Payer: Self-pay | Admitting: Gastroenterology

## 2023-01-03 ENCOUNTER — Encounter (HOSPITAL_BASED_OUTPATIENT_CLINIC_OR_DEPARTMENT_OTHER): Payer: Self-pay | Admitting: Emergency Medicine

## 2023-01-03 ENCOUNTER — Other Ambulatory Visit: Payer: Self-pay

## 2023-01-03 ENCOUNTER — Emergency Department (HOSPITAL_BASED_OUTPATIENT_CLINIC_OR_DEPARTMENT_OTHER): Payer: 59

## 2023-01-03 ENCOUNTER — Emergency Department (HOSPITAL_BASED_OUTPATIENT_CLINIC_OR_DEPARTMENT_OTHER)
Admission: EM | Admit: 2023-01-03 | Discharge: 2023-01-03 | Disposition: A | Discharge status: Home / Self Care | Payer: 59 | Attending: Emergency Medicine | Admitting: Emergency Medicine

## 2023-01-03 DIAGNOSIS — N132 Hydronephrosis with renal and ureteral calculous obstruction: Secondary | ICD-10-CM | POA: Insufficient documentation

## 2023-01-03 DIAGNOSIS — R109 Unspecified abdominal pain: Secondary | ICD-10-CM | POA: Diagnosis present

## 2023-01-03 DIAGNOSIS — N200 Calculus of kidney: Secondary | ICD-10-CM

## 2023-01-03 LAB — COMPREHENSIVE METABOLIC PANEL
ALT: 10 U/L (ref 0–44)
AST: 10 U/L — ABNORMAL LOW (ref 15–41)
Albumin: 4 g/dL (ref 3.5–5.0)
Alkaline Phosphatase: 46 U/L (ref 38–126)
Anion gap: 6 (ref 5–15)
BUN: 17 mg/dL (ref 6–20)
CO2: 23 mmol/L (ref 22–32)
Calcium: 9.1 mg/dL (ref 8.9–10.3)
Chloride: 107 mmol/L (ref 98–111)
Creatinine, Ser: 0.78 mg/dL (ref 0.44–1.00)
GFR, Estimated: >60 mL/min (ref >60)
Glucose, Bld: 97 mg/dL (ref 70–99)
Potassium: 3.9 mmol/L (ref 3.5–5.1)
Sodium: 136 mmol/L (ref 135–145)
Total Bilirubin: 0.3 mg/dL (ref 0.3–1.2)
Total Protein: 6.3 g/dL — ABNORMAL LOW (ref 6.5–8.1)

## 2023-01-03 LAB — LIPASE, BLOOD: Lipase: 38 U/L (ref 11–51)

## 2023-01-03 LAB — CBC WITH DIFFERENTIAL/PLATELET
Abs Immature Granulocytes: 0.03 10*3/uL (ref 0.00–0.07)
Basophils Absolute: 0.1 10*3/uL (ref 0.0–0.1)
Basophils Relative: 1 %
Eosinophils Absolute: 1 10*3/uL — ABNORMAL HIGH (ref 0.0–0.5)
Eosinophils Relative: 15 %
HCT: 39.9 % (ref 36.0–46.0)
Hemoglobin: 12.9 g/dL (ref 12.0–15.0)
Immature Granulocytes: 1 %
Lymphocytes Relative: 33 %
Lymphs Abs: 2.1 10*3/uL (ref 0.7–4.0)
MCH: 29.7 pg (ref 26.0–34.0)
MCHC: 32.3 g/dL (ref 30.0–36.0)
MCV: 91.7 fL (ref 80.0–100.0)
Monocytes Absolute: 0.5 10*3/uL (ref 0.1–1.0)
Monocytes Relative: 7 %
Neutro Abs: 2.8 10*3/uL (ref 1.7–7.7)
Neutrophils Relative %: 43 %
Platelets: 319 10*3/uL (ref 150–400)
RBC: 4.35 MIL/uL (ref 3.87–5.11)
RDW: 13.3 % (ref 11.5–15.5)
WBC: 6.5 10*3/uL (ref 4.0–10.5)
nRBC: 0 % (ref 0.0–0.2)

## 2023-01-03 LAB — URINALYSIS, ROUTINE W REFLEX MICROSCOPIC
Bilirubin Urine: NEGATIVE
Glucose, UA: NEGATIVE mg/dL
Ketones, ur: NEGATIVE mg/dL
Leukocytes,Ua: NEGATIVE
Nitrite: NEGATIVE
Protein, ur: NEGATIVE mg/dL
Specific Gravity, Urine: 1.006 (ref 1.005–1.030)
pH: 7.5 (ref 5.0–8.0)

## 2023-01-03 LAB — PREGNANCY, URINE: Preg Test, Ur: NEGATIVE

## 2023-01-03 MED ORDER — ONDANSETRON 4 MG PO TBDP: ORAL_TABLET | ORAL | 0 refills | Status: AC

## 2023-01-03 MED ORDER — MORPHINE SULFATE (PF) 4 MG/ML IV SOLN
4.0000 mg | Freq: Once | INTRAVENOUS | Status: AC
  Administered 2023-01-03: 4 mg via INTRAVENOUS
  Filled 2023-01-03: qty 1

## 2023-01-03 MED ORDER — ONDANSETRON HCL 4 MG/2ML IJ SOLN
4.0000 mg | Freq: Once | INTRAMUSCULAR | Status: AC
  Administered 2023-01-03: 4 mg via INTRAVENOUS
  Filled 2023-01-03: qty 2

## 2023-01-03 MED ORDER — KETOROLAC TROMETHAMINE 15 MG/ML IJ SOLN
15.0000 mg | Freq: Once | INTRAMUSCULAR | Status: AC
  Administered 2023-01-03: 15 mg via INTRAVENOUS
  Filled 2023-01-03: qty 1

## 2023-01-03 MED ORDER — MORPHINE SULFATE 15 MG PO TABS: 7.5000 mg | ORAL_TABLET | ORAL | 0 refills | Status: DC | PRN

## 2023-01-03 MED ORDER — TAMSULOSIN HCL 0.4 MG PO CAPS
0.4000 mg | ORAL_CAPSULE | Freq: Every day | ORAL | 0 refills | Status: AC
Start: 2023-01-03 — End: ?

## 2023-01-03 MED ORDER — SODIUM CHLORIDE 0.9 % IV BOLUS
1000.0000 mL | Freq: Once | INTRAVENOUS | Status: AC
  Administered 2023-01-03: 1000 mL via INTRAVENOUS

## 2023-01-03 NOTE — ED Triage Notes (Signed)
Pt dx UTI on Saturday (had blood in urine), started on cipro (was not in any pain.) this am her right pelvis/lower abdomen severe pain.

## 2023-01-03 NOTE — ED Notes (Signed)
Pt discharged in stable condition. Pt expressed understanding about discharge instructions and Rx, and to follow up with pcp and urology and to return to ER for any further concerns or complications. Pt called her ride while this RN was in the room. Pt expressed understanding not to drive after having narcotic pain medications or when taking them in the future. Pt ambulated out with even steady gait, no apparent distress.

## 2023-01-03 NOTE — Discharge Instructions (Signed)
You have a kidney stone.  This likely the cause of your pain.  I have prescribed you pain nausea medicine and a medicine that will theoretically make the tubes bigger since stone can pass.  Please call the urology office first thing tomorrow to try and set up an appointment.  Please return for uncontrolled pain fever or inability eat or drink.  Take 4 over the counter ibuprofen tablets 3 times a day or 2 over-the-counter naproxen tablets twice a day for pain. Also take tylenol 1000mg (2 extra strength) four times a day.   Then take the pain medicine if you feel like you need it. Narcotics do not help with the pain, they only make you care about it less.  You can become addicted to this, people may break into your house to steal it.  It will constipate you.  If you drive under the influence of this medicine you can get a DUI.

## 2023-01-03 NOTE — ED Provider Notes (Signed)
[name] [facility] Provider Note   CSN: [id number] Arrival date & time: 01/03/23  4166     History  No chief complaint on file.   Krista George is a 47 y.o. female.  47 yo F with a chief complaints of right sided abdominal discomfort.  Patient said that she did not feel well last night and had difficulty sleeping and then about 4 AM started to have severe right-sided pain that seems to come and go.  She recently finished her menstrual cycle and realized that she had ongoing blood in her urine and so went to urgent care.  She started on ciprofloxacin.  She denied dysuria increased frequency or hesitancy.  She denies any flank pain.  Has had nausea but no vomiting.        Home Medications Prior to Admission medications   Medication Sig Start Date End Date Taking? Authorizing Provider  morphine (MSIR) 15 MG tablet Take 0.5 tablets (7.5 mg total) by mouth every 4 (four) hours as needed for severe pain (pain score 7-10). 01/03/23  Yes Melene Plan, DO  ondansetron (ZOFRAN-ODT) 4 MG disintegrating tablet 4mg  ODT q4 hours prn nausea/vomit 01/03/23  Yes Melene Plan, DO  tamsulosin (FLOMAX) 0.4 MG CAPS capsule Take 1 capsule (0.4 mg total) by mouth daily after supper. 01/03/23  Yes Melene Plan, DO  Cholecalciferol (VITAMIN D3) 1.25 MG (50000 UT) CAPS Take by mouth. 05/28/20   [provider]  FLUoxetine (PROZAC) 40 MG capsule Take 80 mg by mouth daily.  05/17/12   Jaymes Graff, MD  Multiple Vitamin (MULTI VITAMIN) TABS 1 tablet    [provider]  Omega-3 Fatty Acids (FISH OIL) 1000 MG CAPS 1 capsule    [provider]  omeprazole (PRILOSEC OTC) 20 MG tablet Take 20 mg by mouth daily.    [provider]  ondansetron (ZOFRAN) 4 MG tablet Take 1 tablet (4 mg total) by mouth every 8 (eight) hours as needed for nausea or vomiting. 04/10/20   Meredith Pel, NP  rosuvastatin (CRESTOR) 10 MG tablet Take 10 mg by mouth daily.     [provider]  topiramate (TOPAMAX) 100 MG tablet Take by mouth. 05/28/20   [provider]      Allergies    Aspirin and Nsaids    Review of Systems   Review of Systems  Physical Exam Updated Vital Signs BP 120/87   Pulse 60   Temp 97.9 F (36.6 C) (Oral)   Resp 17   Wt 88.5 kg   SpO2 95%   BMI 31.47 kg/m  Physical Exam Vitals and nursing note reviewed.  Constitutional:      General: She is not in acute distress.    Appearance: She is well-developed. She is not diaphoretic.  HENT:     Head: Normocephalic and atraumatic.  Eyes:     Pupils: Pupils are equal, round, and reactive to light.  Cardiovascular:     Rate and Rhythm: Normal rate and regular rhythm.     Heart sounds: No murmur heard.    No friction rub. No gallop.  Pulmonary:     Effort: Pulmonary effort is normal.     Breath sounds: No wheezing or rales.  Abdominal:     General: There is no distension.     Palpations: Abdomen is soft.     Tenderness: There is no abdominal tenderness.     Comments: Benign abdominal exam.  No CVA tenderness.  Musculoskeletal:  General: No tenderness.     Cervical back: Normal range of motion and neck supple.  Skin:    General: Skin is warm and dry.  Neurological:     Mental Status: She is alert and oriented to person, place, and time.  Psychiatric:        Behavior: Behavior normal.     ED Results / Procedures / Treatments   Labs (all labs ordered are listed, but only abnormal results are displayed) Labs Reviewed  CBC WITH DIFFERENTIAL/PLATELET - Abnormal; Notable for the following components:      Result Value   Eosinophils Absolute 1.0 (*)    All other components within normal limits  COMPREHENSIVE METABOLIC PANEL - Abnormal; Notable for the following components:   Total Protein 6.3 (*)    AST 10 (*)    All other components within normal limits  URINALYSIS, ROUTINE W REFLEX MICROSCOPIC - Abnormal; Notable for the following components:    Color, Urine COLORLESS (*)    Hgb urine dipstick LARGE (*)    Bacteria, UA RARE (*)    All other components within normal limits  LIPASE, BLOOD  PREGNANCY, URINE    EKG None  Radiology CT Renal Stone Study  Result Date: 01/03/2023 CLINICAL DATA:  Right pelvis/right lower abdominal pain. EXAM: CT ABDOMEN AND PELVIS WITHOUT CONTRAST TECHNIQUE: Multidetector CT imaging of the abdomen and pelvis was performed following the standard protocol without IV contrast. RADIATION DOSE REDUCTION: This exam was performed according to the departmental dose-optimization program which includes automated exposure control, adjustment of the mA and/or kV according to patient size and/or use of iterative reconstruction technique. COMPARISON:  CT abdomen pelvis dated 10/28/2022. FINDINGS: Lower chest: No acute abnormality. Hepatobiliary: No focal liver abnormality is seen. No gallstones, gallbladder wall thickening, or biliary dilatation. Pancreas: Unremarkable. No pancreatic ductal dilatation or surrounding inflammatory changes. Spleen: Normal in size without focal abnormality. Adrenals/Urinary Tract: Adrenal glands are unremarkable. There is an obstructing 6 x 4 mm calculus in the proximal right ureter resulting in mild right hydronephrosis and perinephric fat stranding. There is a nonobstructive left renal calculus which measures 10 x 6 mm. No left hydronephrosis. Bladder is unremarkable. Stomach/Bowel: Stomach is within normal limits. Appendix appears normal. No evidence of bowel wall thickening, distention, or inflammatory changes. Vascular/Lymphatic: No significant vascular findings are present. Ileocolic lymph nodes measure 9 mm in short axis, unchanged from prior exam. Reproductive: Uterus and bilateral adnexa are unremarkable. Other: No abdominal wall hernia or abnormality. No abdominopelvic ascites. Musculoskeletal: Degenerative changes are seen in the spine. IMPRESSION: 1. Obstructing 6 x 4 mm calculus in the  proximal right ureter resulting in mild right hydronephrosis and perinephric fat stranding. Electronically Signed   By: Romona Curls M.D.   On: 01/03/2023 10:46    Procedures Procedures    Medications Ordered in ED Medications  ketorolac (TORADOL) 15 MG/ML injection 15 mg (has no administration in time range)  morphine (PF) 4 MG/ML injection 4 mg (4 mg Intravenous Given 01/03/23 0950)  ondansetron (ZOFRAN) injection 4 mg (4 mg Intravenous Given 01/03/23 0950)  sodium chloride 0.9 % bolus 1,000 mL (1,000 mLs Intravenous New Bag/Given 01/03/23 1012)    ED Course/ Medical Decision Making/ A&P                                 Medical Decision Making Amount and/or Complexity of Data Reviewed Labs: ordered. Radiology: ordered.  Risk Prescription drug  management.   47 yo F with a chief complaints of right-sided abdominal discomfort.  By history and physical this sounds most likely kidney stone.  Will treat pain and nausea.  CT imaging.  Reassess.  Patient is feeling much better on repeat assessment.  No leukocytosis no anemia, no significant electrolyte abnormalities.  LFTs and lipase unremarkable.  UA has hematuria but no obvious signs of infection.  CT stone study on my independent interpretation with a proximal right-sided stone.  Radiology read measured 6 x 4 mm.  I discussed results with the patient.  She is feeling much better.  Urology follow-up.  11:03 AM:  I have discussed the diagnosis/risks/treatment options with the patient.  Evaluation and diagnostic testing in the emergency department does not suggest an emergent condition requiring admission or immediate intervention beyond what has been performed at this time.  They will follow up with PCP, Urology. We also discussed returning to the ED immediately if new or worsening sx occur. We discussed the sx which are most concerning (e.g., sudden worsening pain, fever, inability to tolerate by mouth) that necessitate immediate return.  Medications administered to the patient during their visit and any new prescriptions provided to the patient are listed below.  Medications given during this visit Medications  ketorolac (TORADOL) 15 MG/ML injection 15 mg (has no administration in time range)  morphine (PF) 4 MG/ML injection 4 mg (4 mg Intravenous Given 01/03/23 0950)  ondansetron (ZOFRAN) injection 4 mg (4 mg Intravenous Given 01/03/23 0950)  sodium chloride 0.9 % bolus 1,000 mL (1,000 mLs Intravenous New Bag/Given 01/03/23 1012)     The patient appears reasonably screen and/or stabilized for discharge and I doubt any other medical condition or other [facility] requiring further screening, evaluation, or treatment in the ED at this time prior to discharge.          Final Clinical Impression(s) / ED Diagnoses Final diagnoses:  Nephrolithiasis    Rx / DC Orders ED Discharge Orders          Ordered    morphine (MSIR) 15 MG tablet  Every 4 hours PRN        01/03/23 1101    ondansetron (ZOFRAN-ODT) 4 MG disintegrating tablet        01/03/23 1101    tamsulosin (FLOMAX) 0.4 MG CAPS capsule  Daily after supper        01/03/23 1101              Melene Plan, DO 01/03/23 1103

## 2023-01-04 ENCOUNTER — Ambulatory Visit (HOSPITAL_BASED_OUTPATIENT_CLINIC_OR_DEPARTMENT_OTHER)
Admission: RE | Admit: 2023-01-04 | Discharge: 2023-01-04 | Disposition: A | Discharge status: Home / Self Care | Payer: 59 | Attending: Urology | Admitting: Urology

## 2023-01-04 ENCOUNTER — Encounter (HOSPITAL_BASED_OUTPATIENT_CLINIC_OR_DEPARTMENT_OTHER)
Admission: RE | Disposition: A | Discharge status: Home / Self Care | Payer: Self-pay | Source: Home / Self Care | Attending: Urology

## 2023-01-04 ENCOUNTER — Other Ambulatory Visit: Payer: Self-pay

## 2023-01-04 ENCOUNTER — Other Ambulatory Visit: Payer: Self-pay | Admitting: Urology

## 2023-01-04 ENCOUNTER — Encounter (HOSPITAL_BASED_OUTPATIENT_CLINIC_OR_DEPARTMENT_OTHER): Payer: Self-pay | Admitting: Urology

## 2023-01-04 DIAGNOSIS — E669 Obesity, unspecified: Secondary | ICD-10-CM | POA: Diagnosis not present

## 2023-01-04 DIAGNOSIS — Z01818 Encounter for other preprocedural examination: Secondary | ICD-10-CM

## 2023-01-04 DIAGNOSIS — Z6832 Body mass index (BMI) 32.0-32.9, adult: Secondary | ICD-10-CM | POA: Insufficient documentation

## 2023-01-04 DIAGNOSIS — N201 Calculus of ureter: Secondary | ICD-10-CM | POA: Diagnosis present

## 2023-01-04 HISTORY — PX: EXTRACORPOREAL SHOCK WAVE LITHOTRIPSY: SHX1557

## 2023-01-04 LAB — POCT PREGNANCY, URINE: Preg Test, Ur: NEGATIVE

## 2023-01-04 SURGERY — LITHOTRIPSY, ESWL
Anesthesia: LOCAL | Laterality: Right

## 2023-01-04 MED ORDER — SODIUM CHLORIDE 0.9 % IV SOLN: INTRAVENOUS | Status: DC

## 2023-01-04 MED ORDER — DIAZEPAM 5 MG PO TABS
ORAL_TABLET | ORAL | Status: AC
  Filled 2023-01-04: qty 2

## 2023-01-04 MED ORDER — SODIUM CHLORIDE 0.9% FLUSH: 3.0000 mL | Freq: Two times a day (BID) | INTRAVENOUS | Status: DC

## 2023-01-04 MED ORDER — CIPROFLOXACIN HCL 500 MG PO TABS
500.0000 mg | ORAL_TABLET | ORAL | Status: AC
  Administered 2023-01-04: 500 mg via ORAL

## 2023-01-04 MED ORDER — OXYCODONE-ACETAMINOPHEN 5-325 MG PO TABS
1.0000 | ORAL_TABLET | Freq: Four times a day (QID) | ORAL | 0 refills | Status: DC | PRN
Start: 2023-01-04 — End: 2023-01-25

## 2023-01-04 MED ORDER — CIPROFLOXACIN HCL 500 MG PO TABS
ORAL_TABLET | ORAL | Status: AC
  Filled 2023-01-04: qty 1

## 2023-01-04 MED ORDER — DIPHENHYDRAMINE HCL 25 MG PO CAPS
25.0000 mg | ORAL_CAPSULE | ORAL | Status: AC
  Administered 2023-01-04: 25 mg via ORAL

## 2023-01-04 MED ORDER — DIPHENHYDRAMINE HCL 25 MG PO CAPS
ORAL_CAPSULE | ORAL | Status: AC
  Filled 2023-01-04: qty 1

## 2023-01-04 MED ORDER — DIAZEPAM 5 MG PO TABS
10.0000 mg | ORAL_TABLET | ORAL | Status: AC
  Administered 2023-01-04: 10 mg via ORAL

## 2023-01-04 NOTE — Progress Notes (Signed)
Spoke w/ via phone for pre-op interview--- [facility] needs dos----       UPT        COVID test -----patient states asymptomatic no test needed Arrive at ------- 1330 NPO after MN NO Solid Food.  Clear liquids from MN until--- sip of water with pills only Med rec completed. Pt aware to hold ASA/NSAIDs and supplements per [facility] protocol.  Medications to take morning of surgery ----- morphine or tylenol prn Diabetic/Weight loss medication ----- n/a No Alcohol or recreational drugs for 24 hours/Tobacco products for 6 hours ---- last THC vape 01/02/23 Patient instructed to bring blue lithotripsy folder, photo id and insurance card day of surgery. Patient aware to have Driver (ride ) / caregiver for 24 hours after surgery ----- Pt unsure if will be husband or another person Patient Special Instructions ----- Pre-Op special Instructions ----- take laxative of choice day before procedure (n/a - day of add-on) Patient verbalized understanding of instructions that were given at this phone interview. Patient denies shortness of breath, chest pain, fever, cough at this phone interview.  Per pt, last solid food 1700 01/03/23, only sip of water with pills this am ~ 0400 and 1040. Aware not to drink any more.

## 2023-01-04 NOTE — H&P (Signed)
I have ureteral stone.  HPI: Krista George is a 47 year-old female patient who is here for ureteral stone.  Pain is occuring on the right side.   01/04/23: Krista George is a 47 yo female who is a former patient of the practice with a history of stones. She was in the ER over the weekend for right flank pain and was found to have a 4x72mm right proximal stone with mild hydro. Her UA had RBC's only and her CMP and CBC were unremarkable. On KUB today she has no change in the stone location today. She continues to have pain 7/10 with nausea. She has no voiding complaints or fever. She passed her previous stone.      ALLERGIES: NSAIDs    MEDICATIONS: Cipro  Tamsulosin Hcl 0.4 mg capsule 1 capsule PO Daily  Prozac  Topamax  Vitamin D3     GU PSH: None     PSH Notes: Upper Gastrointestinal Endoscopy (Therapeutic), Corneal LASIK Bilateral, Arthroscopy Knee Right, Tonsillectomy, Oral Surgery Tooth Extraction   NON-GU PSH: Dental Surgery Procedure - 2011 Eye Surgery (Unspecified) Knee Arthroscopy; Dx - 2014 Remove Tonsils - 2011     GU PMH: Renal calculus, Nephrolithiasis - 2014 Ureteral calculus, Calculus of ureter - 2014 Urinary Tract Inf, Unspec site, Pyuria - 2014, Urinary tract infection, - 2014      PMH Notes:  1898-03-09 00:00:00 - Note: Normal Routine History And Physical Adult  2012-09-21 07:00:28 - Note: Flank Pain Right   NON-GU PMH: Anxiety, Anxiety (Symptom) - 2014 Gastric ulcer, unspecified as acute or chronic, without hemorrhage or perforation, Gastric Ulcer - 2014 Personal history of other diseases of the digestive system, History of esophageal reflux - 2014 Personal history of other mental and behavioral disorders, History of depression - 2014 Acute gastric ulcer with hemorrhage Depression GERD    FAMILY HISTORY: 1 Daughter - Daughter Aneurysm Of Unspecified Site - Father Family Health Status - Father alive at age 83 - Runs In Family Family Health Status - Mother's  Age - Runs In Family Family Health Status Children _4__ Living Daughter - Runs In Family Hypertension - Mother nephrolithiasis - Father, Sister   SOCIAL HISTORY: Marital Status: Single Preferred Language: English; Ethnicity: Not Hispanic Or Latino; Race: White Current Smoking Status: Patient has never smoked.   Tobacco Use Assessment Completed: Used Tobacco in last 30 days? Has never drank.  Drinks 2 caffeinated drinks per day. Patient's occupation Engineer, agricultural.    REVIEW OF SYSTEMS:    GU Review Female:   Patient denies frequent urination, hard to postpone urination, burning /pain with urination, get up at night to urinate, leakage of urine, stream starts and stops, trouble starting your stream, have to strain to urinate, and being pregnant.  Gastrointestinal (Upper):   Patient reports nausea. Patient denies vomiting and indigestion/ heartburn.  Gastrointestinal (Lower):   Patient denies diarrhea and constipation.  Constitutional:   Patient denies fever, night sweats, weight loss, and fatigue.  Skin:   Patient denies skin rash/ lesion and itching.  Eyes:   Patient denies blurred vision and double vision.  Ears/ Nose/ Throat:   Patient denies sore throat and sinus problems.  Hematologic/Lymphatic:   Patient denies swollen glands and easy bruising.  Cardiovascular:   Patient denies leg swelling and chest pains.  Respiratory:   Patient denies cough and shortness of breath.  Endocrine:   Patient denies excessive thirst.  Musculoskeletal:   Patient denies back pain and joint pain.  Neurological:  Patient reports headaches. Patient denies dizziness.  Psychologic:   Patient denies depression and anxiety.   Notes: Blood in urine    VITAL SIGNS:      01/04/2023 08:58 AM  Weight 195 lb / 88.45 kg  Height 66 in / 167.64 cm  BP 127/81 mmHg  Heart Rate 76 /min  Temperature 98.2 F / 36.7 C  BMI 31.5 kg/m   MULTI-SYSTEM PHYSICAL EXAMINATION:    Constitutional:  Well-nourished. No physical deformities. Normally developed. Good grooming.  Neck: Neck symmetrical, not swollen. Normal tracheal position.  Respiratory: Normal breath sounds. No labored breathing, no use of accessory muscles.   Cardiovascular: Regular rate and rhythm. No murmur, no gallop.   Skin: No paleness, no jaundice, no cyanosis. No lesion, no ulcer, no rash.  Neurologic / Psychiatric: Oriented to time, oriented to place, oriented to person. No depression, no anxiety, no agitation.  Gastrointestinal: Abdominal tenderness in right flank . No mass, no rigidity, non obese abdomen.   Eyes: Normal conjunctivae. Normal eyelids.  Ears, Nose, Mouth, and Throat: Left ear no scars, no lesions, no masses. Right ear no scars, no lesions, no masses. Nose no scars, no lesions, no masses. Normal hearing. Normal lips.  Musculoskeletal: Normal gait and station of head and neck.     Complexity of Data:  Lab Test Review:   CBC with Diff, CMP  Records Review:   Previous Hospital Records, Previous Patient Records  Urine Test Review:   Urinalysis  X-Ray Review: KUB: Reviewed Films. Discussed With Patient.  C.T. Stone Protocol: Reviewed Films. Reviewed Report. Discussed With Patient.     PROCEDURES:         KUB - [id number]  A single view of the abdomen is obtained. 4x34mm stone in the right proximal ureter and a 5-69mm LLP stone. No other bone, gas or soft tissue abnormalities noted.       Patient confirmed No Neulasta OnPro Device.           Urinalysis w/Scope Dipstick Dipstick Cont'd Micro  Color: Yellow Bilirubin: Neg mg/dL WBC/hpf: 0 - 5/hpf  Appearance: Slightly Cloudy Ketones: Neg mg/dL RBC/hpf: 20 - 08/QPY  Specific Gravity: 1.025 Blood: 3+ ery/uL Bacteria: NS (Not Seen)  pH: <=5.0 Protein: Neg mg/dL Cystals: Amorph Urates  Glucose: Neg mg/dL Urobilinogen: 0.2 mg/dL Casts: NS (Not Seen)    Nitrites: Neg Trichomonas: Not Present    Leukocyte Esterase: Neg leu/uL Mucous: Present      Epithelial  Cells: 0 - 5/hpf      Yeast: NS (Not Seen)      Sperm: Not Present    ASSESSMENT:      ICD-10 Details  1 GU:   Ureteral calculus - N20.1 Right, Acute, Systemic Symptoms - She has a 4x27mm right proximal stone. I discussed MET, URS and ESWL and will get her set up for ESWL this afternoon.   I reviewed the risks of ESWL including bleeding, infection, injury to the kidney or adjacent structures, failure to fragment the stone, need for ancillary procedures, thrombotic events, cardiac arrhythmias and sedation complications.      PLAN:           Orders X-Rays: KUB          Schedule Return Visit/Planned Activity: ASAP - Schedule Surgery  Procedure: 01/04/2023 - ESWL - 19509          Document Letter(s):  Created for Patient: Clinical Summary         Notes:   CC: Dr. Jasmine December  Wolters.         Next Appointment:      Next Appointment: 01/19/2023 02:30 PM    Appointment Type: KUB    Location: [facility] [phone]    Provider: KUB KUB    Reason for Visit: PO KUB

## 2023-01-04 NOTE — Discharge Instructions (Addendum)

## 2023-01-04 NOTE — Interval H&P Note (Signed)
History and Physical Interval Note:  01/04/2023 3:30 PM  Krista George  has presented today for surgery, with the diagnosis of RIGHT PROXIMAL CALCULUS.  The various methods of treatment have been discussed with the patient and family. After consideration of risks, benefits and other options for treatment, the patient has consented to  Procedure(s) with comments: RIGHT EXTRACORPOREAL SHOCK WAVE LITHOTRIPSY (ESWL) (Right) - 75 MINUTES as a surgical intervention.  The patient's history has been reviewed, patient examined, no change in status, stable for surgery.  I have reviewed the patient's chart and labs.  Questions were answered to the patient's satisfaction.     Bjorn Pippin

## 2023-01-04 NOTE — Op Note (Signed)
See [facility] scanned noted

## 2023-01-05 ENCOUNTER — Encounter (HOSPITAL_BASED_OUTPATIENT_CLINIC_OR_DEPARTMENT_OTHER): Payer: Self-pay | Admitting: Urology

## 2023-01-22 ENCOUNTER — Other Ambulatory Visit: Payer: Self-pay | Admitting: Urology

## 2023-01-22 ENCOUNTER — Encounter (HOSPITAL_BASED_OUTPATIENT_CLINIC_OR_DEPARTMENT_OTHER): Payer: Self-pay | Admitting: Urology

## 2023-01-22 NOTE — Progress Notes (Signed)
Attempted call and speak with client by phone in regards to Procedure on Monday, Nov 18th, 2024 [facility]) No answer, message left on voice mail

## 2023-01-22 NOTE — Pre-Procedure Instructions (Signed)
Pre Procedure ([facility]) Client Phone Call Spoke with client by phone Arrival time to be Monday, November 18th, 2024 @ 0800hrs to the [facility] Confirmed procedure and dx with client Instructed client to be NPO after midnight prior to [facility] Procedure, informed she may have clear liquids up to 6 hours prior to procedure Client stated she does Vape with THC, was instructed to no vape beginning today. Denies any other drug use, no patches, non smoker Denies any signs or symptoms of flu or covid for the past 90 days Instructed client also of appropriate clothing for procedure day as well Confirmed responsible person will be here with her day of procedure as well.  Denies any chest pain, shortness of breath with activity, able to walk independently  Denies any herbal drugs, blood thinners or vitamins.  Opportunity for questions provided during phone call. Also instructed client to call us Monday am if having fever, cough, flu like symptoms or states she has passed her stone. To also call [facility] as well.  Phone call ended, information placed in [facility] Chart

## 2023-01-25 ENCOUNTER — Encounter (HOSPITAL_BASED_OUTPATIENT_CLINIC_OR_DEPARTMENT_OTHER)
Admission: RE | Disposition: A | Discharge status: Home / Self Care | Payer: Self-pay | Source: Home / Self Care | Attending: Urology

## 2023-01-25 ENCOUNTER — Other Ambulatory Visit: Payer: Self-pay

## 2023-01-25 ENCOUNTER — Encounter (HOSPITAL_BASED_OUTPATIENT_CLINIC_OR_DEPARTMENT_OTHER): Payer: Self-pay | Admitting: Urology

## 2023-01-25 ENCOUNTER — Ambulatory Visit (HOSPITAL_COMMUNITY): Payer: 59

## 2023-01-25 ENCOUNTER — Ambulatory Visit (HOSPITAL_BASED_OUTPATIENT_CLINIC_OR_DEPARTMENT_OTHER)
Admission: RE | Admit: 2023-01-25 | Discharge: 2023-01-25 | Disposition: A | Discharge status: Home / Self Care | Payer: 59 | Attending: Urology | Admitting: Urology

## 2023-01-25 DIAGNOSIS — N200 Calculus of kidney: Secondary | ICD-10-CM | POA: Diagnosis present

## 2023-01-25 DIAGNOSIS — Z87442 Personal history of urinary calculi: Secondary | ICD-10-CM | POA: Diagnosis not present

## 2023-01-25 HISTORY — PX: EXTRACORPOREAL SHOCK WAVE LITHOTRIPSY: SHX1557

## 2023-01-25 LAB — POCT PREGNANCY, URINE: Preg Test, Ur: NEGATIVE

## 2023-01-25 SURGERY — LITHOTRIPSY, ESWL
Anesthesia: LOCAL | Laterality: Left

## 2023-01-25 MED ORDER — CIPROFLOXACIN HCL 500 MG PO TABS
ORAL_TABLET | ORAL | Status: AC
  Filled 2023-01-25: qty 1

## 2023-01-25 MED ORDER — OXYCODONE-ACETAMINOPHEN 5-325 MG PO TABS: 1.0000 | ORAL_TABLET | Freq: Four times a day (QID) | ORAL | 0 refills | Status: AC | PRN

## 2023-01-25 MED ORDER — CIPROFLOXACIN HCL 500 MG PO TABS
500.0000 mg | ORAL_TABLET | ORAL | Status: AC
  Administered 2023-01-25: 500 mg via ORAL

## 2023-01-25 MED ORDER — DIPHENHYDRAMINE HCL 25 MG PO CAPS
25.0000 mg | ORAL_CAPSULE | ORAL | Status: AC
  Administered 2023-01-25: 25 mg via ORAL

## 2023-01-25 MED ORDER — DIAZEPAM 5 MG PO TABS
ORAL_TABLET | ORAL | Status: AC
  Filled 2023-01-25: qty 2

## 2023-01-25 MED ORDER — DIAZEPAM 5 MG PO TABS
10.0000 mg | ORAL_TABLET | ORAL | Status: AC
  Administered 2023-01-25: 10 mg via ORAL

## 2023-01-25 MED ORDER — DIPHENHYDRAMINE HCL 25 MG PO CAPS
ORAL_CAPSULE | ORAL | Status: AC
  Filled 2023-01-25: qty 1

## 2023-01-25 MED ORDER — SODIUM CHLORIDE 0.9 % IV SOLN: INTRAVENOUS | Status: DC

## 2023-01-25 NOTE — H&P (Signed)
01/19/2023: Patient underwent shockwave lithotripsy on 10/28 to treat a previously done 5 symptomatic right proximal ureteral calculi. She presented to clinic 2 days later with renal colic that alleviated with Toradol injection. This was significantly helpful in about 2 days later patient had passed a substantial amount of stone material which she brings in today for analysis. Since stone material passage her prior endorsed symptoms have all but resolved. No longer having pain or discomfort suggestive of obstructive uropathy. She is voiding at her baseline with stable grossly nonbothersome symptomology including absence of any dysuria or gross hematuria. UA today within normal limits. Patient is interested in pursuing additional treatment of her known stone burden on the left side.     ALLERGIES: NSAIDs    MEDICATIONS: Prozac  Topamax  Vitamin D3     GU PSH: No GU PSH      PSH Notes: Upper Gastrointestinal Endoscopy (Therapeutic), Corneal LASIK Bilateral, Arthroscopy Knee Right, Tonsillectomy, Oral Surgery Tooth Extraction   NON-GU PSH: Dental Surgery Procedure - 2011 Eye Surgery (Unspecified) Knee Arthroscopy; Dx - 2014 Remove Tonsils - 2011     GU PMH: Ureteral calculus - 01/06/2023, She has a 4x35mm right proximal stone. I discussed MET, URS and ESWL and will get her set up for ESWL this afternoon. I reviewed the risks of ESWL including bleeding, infection, injury to the kidney or adjacent structures, failure to fragment the stone, need for ancillary procedures, thrombotic events, cardiac arrhythmias and sedation complications. , - 01/04/2023, Calculus of ureter, - 2014 Renal calculus, Nephrolithiasis - 2014 Urinary Tract Inf, Unspec site, Urinary tract infection - 2014, Pyuria, - 2014      PMH Notes:  1898-03-09 00:00:00 - Note: Normal Routine History And Physical Adult  2012-09-21 07:00:28 - Note: Flank Pain Right   NON-GU PMH: Anxiety, Anxiety (Symptom) - 2014 Gastric ulcer,  unspecified as acute or chronic, without hemorrhage or perforation, Gastric Ulcer - 2014 Personal history of other diseases of the digestive system, History of esophageal reflux - 2014 Personal history of other mental and behavioral disorders, History of depression - 2014 Acute gastric ulcer with hemorrhage Depression GERD    FAMILY HISTORY: 1 Daughter - Daughter Aneurysm Of Unspecified Site - Father Family Health Status - Father alive at age 77 - Runs In Family Family Health Status - Mother's Age - Runs In Family Family Health Status Children _4__ Living Daughter - Runs In Family Hypertension - Mother nephrolithiasis - Father, Sister   SOCIAL HISTORY: Marital Status: Single Preferred Language: English; Ethnicity: Not Hispanic Or Latino; Race: White Current Smoking Status: Patient has never smoked.   Tobacco Use Assessment Completed: Used Tobacco in last 30 days? Has never drank.  Drinks 2 caffeinated drinks per day. Patient's occupation Engineer, agricultural.    REVIEW OF SYSTEMS:    GU Review Female:   Patient denies frequent urination, hard to postpone urination, burning /pain with urination, get up at night to urinate, leakage of urine, stream starts and stops, trouble starting your stream, have to strain to urinate, and being pregnant.  Gastrointestinal (Upper):   Patient denies nausea, vomiting, and indigestion/ heartburn.  Gastrointestinal (Lower):   Patient denies diarrhea and constipation.  Constitutional:   Patient denies fever, night sweats, weight loss, and fatigue.  Skin:   Patient denies skin rash/ lesion and itching.  Eyes:   Patient denies blurred vision and double vision.  Ears/ Nose/ Throat:   Patient denies sore throat and sinus problems.  Hematologic/Lymphatic:   Patient denies easy  bruising and swollen glands.  Cardiovascular:   Patient denies leg swelling and chest pains.  Respiratory:   Patient denies cough and shortness of breath.  Endocrine:   Patient  denies excessive thirst.  Musculoskeletal:   Patient denies back pain and joint pain.  Neurological:   Patient denies headaches and dizziness.  Psychologic:   Patient denies depression and anxiety.   VITAL SIGNS:      01/19/2023 03:07 PM  Weight 192 lb / 87.09 kg  Height 66 in / 167.64 cm  BP 117/78 mmHg  Pulse 86 /min  BMI 31.0 kg/m   MULTI-SYSTEM PHYSICAL EXAMINATION:    Constitutional: Well-nourished. No physical deformities. Normally developed. Good grooming.  Neck: Neck symmetrical, not swollen. Normal tracheal position.  Respiratory: No labored breathing, no use of accessory muscles.   Cardiovascular: Normal temperature, normal extremity pulses, no swelling, no varicosities.  Neurologic / Psychiatric: Oriented to time, oriented to place, oriented to person. No depression, no anxiety, no agitation.  Gastrointestinal: No mass, no tenderness, no rigidity, non obese abdomen.  Musculoskeletal: Normal gait and station of head and neck.     Complexity of Data:  Source Of History:  Patient, Medical Record Summary  Records Review:   Previous Doctor Records, Previous Hospital Records, Previous Patient Records  Urine Test Review:   Urinalysis  X-Ray Review: KUB: Reviewed Films. Discussed With Patient.     01/19/23  Urinalysis  Urine Appearance Clear   Urine Color Yellow   Urine Glucose Neg mg/dL  Urine Bilirubin Neg mg/dL  Urine Ketones Neg mg/dL  Urine Specific Gravity 1.025   Urine Blood Neg ery/uL  Urine pH 5.5   Urine Protein Trace mg/dL  Urine Urobilinogen 1.0 mg/dL  Urine Nitrites Neg   Urine Leukocyte Esterase Neg leu/uL   PROCEDURES:         KUB - [id number]  A single view of the abdomen is obtained. Previous identified right ureteral calculi is no longer visible. Correlated with prior CT imaging, the right kidney and ureter show no obvious opacities consistent with nephrolithiasis. Patient continues with a lower pole left renal calculi measuring approximately 7 mm. An  obvious left ureteral calculi is not appreciated on today's study. She has numerous bilateral pelvic phleboliths which are all grossly stable appearing. Bladder grossly appears free of obstruction.      . Patient confirmed No Neulasta OnPro Device.           Urinalysis Dipstick Dipstick Cont'd  Color: Yellow Bilirubin: Neg mg/dL  Appearance: Clear Ketones: Neg mg/dL  Specific Gravity: 6.213 Blood: Neg ery/uL  pH: 5.5 Protein: Trace mg/dL  Glucose: Neg mg/dL Urobilinogen: 1.0 mg/dL    Nitrites: Neg    Leukocyte Esterase: Neg leu/uL    ASSESSMENT:      ICD-10 Details  1 GU:   Renal calculus - N20.0 Left, Chronic, Stable  2   Ureteral calculus - N20.1 Right, Acute, Complicated Injury, Improving   PLAN:            Medications Stop Meds: Cipro  Discontinue: 01/19/2023  - Reason: Finished Tamsulosin Hcl 0.4 mg capsule 1 capsule PO Daily  Discontinue: 01/19/2023  - Reason: Patient stopped           Orders Labs Stone Analysis          Schedule Return Visit/Planned Activity: Next Available Appointment - Schedule Surgery          Document Letter(s):  Created for Patient: Clinical Summary  Notes:   Despite having acute colic following shockwave lithotripsy to treat his stone burden has appropriately cleared. Patient now most interested in pursuing treatment for her known left lower pole renal calculi. She is a candidate for both lithotripsy as well as ureteroscopy understanding the risks versus benefits which were discussed in detail and potential need for repeat treatment should first attempt at both procedures be unsuccessful. She also understands the need for a stent following ureteroscopy as well. Ultimately the patient would like to defer to Dr. Belva Crome judgment so I will send a message regarding our conversation today. Based on that, the patient will then be contacted and scheduled appropriately.

## 2023-01-25 NOTE — Op Note (Signed)
See [facility] OP note scanned into chart. Also because of the size, density, location and other factors that cannot be anticipated I feel this will likely be a staged procedure. This fact supersedes any indication in the scanned [facility] operative note to the contrary.  

## 2023-01-25 NOTE — Discharge Instructions (Addendum)
See [facility] discharge instructions in chart.   Post Anesthesia Home Care Instructions  Activity: Get plenty of rest for the remainder of the day. A responsible individual must stay with you for 24 hours following the procedure.  For the next 24 hours, DO NOT: -Drive a car -Paediatric nurse -Drink alcoholic beverages -Take any medication unless instructed by your physician -Make any legal decisions or sign important papers.  Meals: Start with liquid foods such as gelatin or soup. Progress to regular foods as tolerated. Avoid greasy, spicy, heavy foods. If nausea and/or vomiting occur, drink only clear liquids until the nausea and/or vomiting subsides. Call your physician if vomiting continues.  Special Instructions/Symptoms: Your throat may feel dry or sore from the anesthesia or the breathing tube placed in your throat during surgery. If this causes discomfort, gargle with warm salt water. The discomfort should disappear within 24 hours.

## 2023-01-26 ENCOUNTER — Encounter (HOSPITAL_BASED_OUTPATIENT_CLINIC_OR_DEPARTMENT_OTHER): Payer: Self-pay | Admitting: Urology
